# Patient Record
Sex: Female | Born: 1965
Health system: Southern US, Community
[De-identification: ages and names within clinical notes are randomized; demographics above are authoritative.]

## PROBLEM LIST (undated history)

## (undated) DIAGNOSIS — N920 Excessive and frequent menstruation with regular cycle: Secondary | ICD-10-CM

## (undated) DIAGNOSIS — I10 Essential (primary) hypertension: Secondary | ICD-10-CM

## (undated) DIAGNOSIS — G4700 Insomnia, unspecified: Secondary | ICD-10-CM

## (undated) DIAGNOSIS — K219 Gastro-esophageal reflux disease without esophagitis: Secondary | ICD-10-CM

## (undated) DIAGNOSIS — R112 Nausea with vomiting, unspecified: Secondary | ICD-10-CM

## (undated) DIAGNOSIS — D649 Anemia, unspecified: Secondary | ICD-10-CM

## (undated) DIAGNOSIS — Z9889 Other specified postprocedural states: Secondary | ICD-10-CM

## (undated) HISTORY — PX: CHOLECYSTECTOMY: SHX55

## (undated) HISTORY — DX: Essential (primary) hypertension: I10

## (undated) HISTORY — PX: TUBAL LIGATION: SHX77

## (undated) HISTORY — DX: Excessive and frequent menstruation with regular cycle: N92.0

## (undated) HISTORY — PX: CARPAL TUNNEL RELEASE: SHX101

---

## 1998-04-23 ENCOUNTER — Inpatient Hospital Stay (HOSPITAL_COMMUNITY): Admission: AD | Admit: 1998-04-23 | Discharge: 1998-04-23 | Payer: Self-pay | Admitting: Obstetrics

## 1998-04-26 ENCOUNTER — Inpatient Hospital Stay (HOSPITAL_COMMUNITY): Admission: AD | Admit: 1998-04-26 | Discharge: 1998-04-26 | Payer: Self-pay | Admitting: Obstetrics

## 1998-07-29 ENCOUNTER — Inpatient Hospital Stay (HOSPITAL_COMMUNITY): Admission: RE | Admit: 1998-07-29 | Discharge: 1998-07-29 | Payer: Self-pay | Admitting: Obstetrics

## 1998-08-18 ENCOUNTER — Inpatient Hospital Stay (HOSPITAL_COMMUNITY): Admission: AD | Admit: 1998-08-18 | Discharge: 1998-08-22 | Payer: Self-pay

## 1999-01-01 ENCOUNTER — Encounter: Payer: Self-pay | Admitting: Emergency Medicine

## 1999-01-01 ENCOUNTER — Emergency Department (HOSPITAL_COMMUNITY): Admission: EM | Admit: 1999-01-01 | Discharge: 1999-01-01 | Payer: Self-pay | Admitting: Emergency Medicine

## 1999-01-03 ENCOUNTER — Emergency Department (HOSPITAL_COMMUNITY): Admission: EM | Admit: 1999-01-03 | Discharge: 1999-01-04 | Payer: Self-pay | Admitting: Emergency Medicine

## 2000-05-12 ENCOUNTER — Encounter: Payer: Self-pay | Admitting: Emergency Medicine

## 2000-05-12 ENCOUNTER — Emergency Department (HOSPITAL_COMMUNITY): Admission: EM | Admit: 2000-05-12 | Discharge: 2000-05-12 | Payer: Self-pay | Admitting: Emergency Medicine

## 2000-05-25 ENCOUNTER — Emergency Department (HOSPITAL_COMMUNITY): Admission: EM | Admit: 2000-05-25 | Discharge: 2000-05-26 | Payer: Self-pay | Admitting: Emergency Medicine

## 2000-06-12 ENCOUNTER — Emergency Department (HOSPITAL_COMMUNITY): Admission: EM | Admit: 2000-06-12 | Discharge: 2000-06-12 | Payer: Self-pay

## 2000-08-02 ENCOUNTER — Encounter: Payer: Self-pay | Admitting: Emergency Medicine

## 2000-08-02 ENCOUNTER — Emergency Department (HOSPITAL_COMMUNITY): Admission: EM | Admit: 2000-08-02 | Discharge: 2000-08-02 | Payer: Self-pay | Admitting: Emergency Medicine

## 2001-06-24 ENCOUNTER — Emergency Department (HOSPITAL_COMMUNITY): Admission: EM | Admit: 2001-06-24 | Discharge: 2001-06-24 | Payer: Self-pay | Admitting: Emergency Medicine

## 2001-07-15 ENCOUNTER — Emergency Department (HOSPITAL_COMMUNITY): Admission: EM | Admit: 2001-07-15 | Discharge: 2001-07-15 | Payer: Self-pay | Admitting: Emergency Medicine

## 2001-07-15 ENCOUNTER — Encounter: Payer: Self-pay | Admitting: Emergency Medicine

## 2002-04-17 ENCOUNTER — Emergency Department (HOSPITAL_COMMUNITY): Admission: EM | Admit: 2002-04-17 | Discharge: 2002-04-17 | Payer: Self-pay

## 2002-05-21 ENCOUNTER — Emergency Department (HOSPITAL_COMMUNITY): Admission: EM | Admit: 2002-05-21 | Discharge: 2002-05-21 | Payer: Self-pay | Admitting: Emergency Medicine

## 2002-08-01 ENCOUNTER — Emergency Department (HOSPITAL_COMMUNITY): Admission: EM | Admit: 2002-08-01 | Discharge: 2002-08-01 | Payer: Self-pay | Admitting: Emergency Medicine

## 2002-11-05 ENCOUNTER — Emergency Department (HOSPITAL_COMMUNITY): Admission: EM | Admit: 2002-11-05 | Discharge: 2002-11-05 | Payer: Self-pay | Admitting: Emergency Medicine

## 2004-10-09 ENCOUNTER — Emergency Department (HOSPITAL_COMMUNITY): Admission: EM | Admit: 2004-10-09 | Discharge: 2004-10-09 | Payer: Self-pay | Admitting: Emergency Medicine

## 2004-10-27 ENCOUNTER — Emergency Department (HOSPITAL_COMMUNITY): Admission: EM | Admit: 2004-10-27 | Discharge: 2004-10-27 | Payer: Self-pay | Admitting: Emergency Medicine

## 2006-03-16 ENCOUNTER — Emergency Department (HOSPITAL_COMMUNITY): Admission: EM | Admit: 2006-03-16 | Discharge: 2006-03-16 | Payer: Self-pay | Admitting: Emergency Medicine

## 2009-08-16 ENCOUNTER — Emergency Department: Payer: Self-pay | Admitting: Emergency Medicine

## 2009-10-25 ENCOUNTER — Emergency Department: Payer: Self-pay | Admitting: Emergency Medicine

## 2010-07-11 ENCOUNTER — Emergency Department (HOSPITAL_COMMUNITY)
Admission: EM | Admit: 2010-07-11 | Discharge: 2010-07-11 | Disposition: A | Discharge status: Home / Self Care | Payer: Medicaid Other | Attending: Emergency Medicine | Admitting: Emergency Medicine

## 2010-07-11 DIAGNOSIS — K219 Gastro-esophageal reflux disease without esophagitis: Secondary | ICD-10-CM | POA: Insufficient documentation

## 2010-07-11 DIAGNOSIS — R109 Unspecified abdominal pain: Secondary | ICD-10-CM | POA: Insufficient documentation

## 2010-07-15 ENCOUNTER — Other Ambulatory Visit: Payer: Self-pay | Admitting: Gastroenterology

## 2010-07-18 ENCOUNTER — Ambulatory Visit
Admission: RE | Admit: 2010-07-18 | Discharge: 2010-07-18 | Disposition: A | Discharge status: Home / Self Care | Payer: Medicaid Other | Source: Ambulatory Visit | Attending: Gastroenterology | Admitting: Gastroenterology

## 2010-08-03 ENCOUNTER — Encounter (HOSPITAL_COMMUNITY)
Admission: RE | Admit: 2010-08-03 | Discharge: 2010-08-03 | Disposition: A | Discharge status: Home / Self Care | Payer: Medicaid Other | Source: Ambulatory Visit | Attending: Surgery | Admitting: Surgery

## 2010-08-03 LAB — SURGICAL PCR SCREEN: MRSA, PCR: NEGATIVE

## 2010-08-03 LAB — COMPREHENSIVE METABOLIC PANEL
ALT: 13 U/L (ref 0–35)
AST: 16 U/L (ref 0–37)
Calcium: 8.8 mg/dL (ref 8.4–10.5)
Creatinine, Ser: 0.74 mg/dL (ref 0.4–1.2)
GFR calc Af Amer: >60 mL/min (ref >60)
Sodium: 138 mEq/L (ref 135–145)
Total Protein: 6.7 g/dL (ref 6.0–8.3)

## 2010-08-03 LAB — CBC
MCHC: 34.1 g/dL (ref 30.0–36.0)
RDW: 13.7 % (ref 11.5–15.5)

## 2010-08-03 LAB — HCG, SERUM, QUALITATIVE: Preg, Serum: NEGATIVE

## 2010-08-05 ENCOUNTER — Other Ambulatory Visit: Payer: Self-pay | Admitting: Surgery

## 2010-08-05 ENCOUNTER — Observation Stay (HOSPITAL_COMMUNITY)
Admission: RE | Admit: 2010-08-05 | Discharge: 2010-08-05 | Disposition: A | Discharge status: Home / Self Care | Payer: Medicaid Other | Source: Ambulatory Visit | Attending: Surgery | Admitting: Surgery

## 2010-08-05 DIAGNOSIS — K8010 Calculus of gallbladder with chronic cholecystitis without obstruction: Principal | ICD-10-CM | POA: Insufficient documentation

## 2010-08-05 DIAGNOSIS — Z01818 Encounter for other preprocedural examination: Secondary | ICD-10-CM | POA: Insufficient documentation

## 2010-08-05 DIAGNOSIS — Z01812 Encounter for preprocedural laboratory examination: Secondary | ICD-10-CM | POA: Insufficient documentation

## 2010-08-18 NOTE — Op Note (Signed)
Terri Hogan, Terri Hogan                  ACCOUNT NO.:  [id number]  MEDICAL RECORD NO.:  [id number]           PATIENT TYPE:  O  LOCATION:  5156                         FACILITY:  [facility]  PHYSICIAN:  Abigail Miyamoto, M.D. DATE OF BIRTH:  10/31/1965  DATE OF PROCEDURE:  08/05/2010 DATE OF DISCHARGE:  08/05/2010                              OPERATIVE REPORT   PREOPERATIVE DIAGNOSIS:  Symptomatic cholelithiasis.  POSTOPERATIVE DIAGNOSES:  Symptomatic cholelithiasis with cholecystitis.  PROCEDURE:  Laparoscopic cholecystectomy.  SURGEON:  Abigail Miyamoto, MD  ANESTHESIA:  General and 0.5% Marcaine.  ESTIMATED BLOOD LOSS:  Minimal.  FINDINGS:  The patient was found to have acute cholecystitis with impacting gallstone in the gallbladder neck and hydrops of the gallbladder.  PROCEDURE IN DETAIL:  The patient was brought to the operating room, identified as Terri Hogan.  She was placed supine on the operating table and general anesthesia was induced.  Her abdomen was then prepped and draped in usual sterile fashion.  Using a #15-blade, a small vertical incision was made above the umbilicus.  This was carried down to the fascia, which was then opened with a scalpel.  Hemostat was used to pass the peritoneal cavity under direct vision.  Next 0-Vicryl pursestring suture was placed around the fascial opening.  The Hasson port was placed through the opening and insufflation of the abdomen was begun.  A 5-mm port was then placed in the patient's epigastrium and 2 more in the right upper quadrant all under direct vision.  The gallbladder was found to be inflamed and distended.  I then needle aspirated bile from the gallbladder in order to grasp the gallbladder.  The bile was clear consistent with hydrops of the gallbladder.  Once the bile was aspirated, I was able to grasp the gallbladder and retract above the liver bed.  Dissection was then carried out at the base of the gallbladder.  The  cystic duct was dissected out and a critical window was achieved around it.  It was then clipped 3 times proximally and once distally and transected.  The cystic artery and posterior branch were identified and proximally and distally transected as well.  The gallbladder was then slowly dissected free from liver bed with this electrocautery.  The gallbladder and liver bed were quite friable.  Once the gallbladder was completely excised from liver bed, I had to achieve hemostasis with cautery as well as 2 pieces of Surgicel material.  At this point, I irrigated the abdomen.  Hemostasis appeared to be achieved.  I then placed the gallbladder in an Endosac, removed it through the incision at the umbilicus.  I then again examined the liver bed and again hemostasis appeared to be achieved.  All ports were removed under direct vision and the abdomen was deflated.  The 0-Vicryl at the umbilicus was tied in place closing the fascial defect.  All incisions were then anesthetized with Marcaine and closed with 4-0 Monocryl subcuticular sutures.  Steri-Strips and Band-Aids were then applied.  The patient tolerated the procedure well. All counts were correct at the end of the procedure.  The patient was  then extubated in the operating room and taken in a stable condition to the recovery room.     Abigail Miyamoto, M.D.     DB/MEDQ  D:  08/05/2010  T:  08/06/2010  Job:  [id number]  Electronically Signed by Abigail Miyamoto M.D. on 08/18/2010 09:33:39 AM

## 2010-08-23 NOTE — Discharge Summary (Signed)
  NAMEMCKAYLIN, Terri Hogan                  ACCOUNT NO.:  [id number]  MEDICAL RECORD NO.:  [id number]           PATIENT TYPE:  O  LOCATION:  5156                         FACILITY:  [facility]  PHYSICIAN:  Abigail Miyamoto, M.D. DATE OF BIRTH:  1965-09-20  DATE OF ADMISSION:  08/05/2010 DATE OF DISCHARGE:  08/05/2010                              DISCHARGE SUMMARY   DISCHARGE DIAGNOSIS:  Symptomatic cholelithiasis with cholecystitis.  SUMMARY OF HISTORY:  This is a female with symptomatic cholelithiasis. She is being admitted for a laparoscopic cholecystectomy.  HOSPITAL COURSE:  The patient was admitted and taken to the operating room where she underwent a laparoscopic cholecystectomy.  She tolerated the procedure well and was taken to the regular floor.  She was then discharged uneventfully.  DISCHARGE DIET:  Regular.  DISCHARGE ACTIVITY:  She is to do no heavy lifting for 2 weeks after discharge.  She will resume her home medications.  She will follow up with [facility] in 2 weeks post discharge.     Abigail Miyamoto, M.D.     DB/MEDQ  D:  08/18/2010  T:  08/19/2010  Job:  [id number]  Electronically Signed by Abigail Miyamoto M.D. on 08/23/2010 10:44:04 AM

## 2011-10-15 ENCOUNTER — Emergency Department: Payer: Self-pay | Admitting: *Deleted

## 2011-10-16 LAB — BASIC METABOLIC PANEL
Anion Gap: 8 (ref 7–16)
BUN: 9 mg/dL (ref 7–18)
Calcium, Total: 7.5 mg/dL — ABNORMAL LOW (ref 8.5–10.1)
Creatinine: 0.79 mg/dL (ref 0.60–1.30)
EGFR (Non-African Amer.): >60
Sodium: 141 mmol/L (ref 136–145)

## 2011-10-16 LAB — CBC WITH DIFFERENTIAL/PLATELET
Basophil #: 0.3 10*3/uL — ABNORMAL HIGH (ref 0.0–0.1)
Basophil %: 3.8 %
Eosinophil %: 1.1 %
HCT: 37.6 % (ref 35.0–47.0)
Lymphocyte #: 1.8 10*3/uL (ref 1.0–3.6)
Lymphocyte %: 20.5 %
MCH: 29.2 pg (ref 26.0–34.0)
MCV: 89 fL (ref 80–100)
Neutrophil #: 6.1 10*3/uL (ref 1.4–6.5)
Neutrophil %: 70.6 %
Platelet: 318 10*3/uL (ref 150–440)
RDW: 14.7 % — ABNORMAL HIGH (ref 11.5–14.5)
WBC: 8.6 10*3/uL (ref 3.6–11.0)

## 2012-07-07 DIAGNOSIS — Z79899 Other long term (current) drug therapy: Secondary | ICD-10-CM | POA: Insufficient documentation

## 2012-07-07 DIAGNOSIS — R079 Chest pain, unspecified: Secondary | ICD-10-CM | POA: Insufficient documentation

## 2012-07-07 DIAGNOSIS — J029 Acute pharyngitis, unspecified: Secondary | ICD-10-CM | POA: Insufficient documentation

## 2012-07-07 DIAGNOSIS — F17200 Nicotine dependence, unspecified, uncomplicated: Secondary | ICD-10-CM | POA: Insufficient documentation

## 2012-07-07 DIAGNOSIS — J40 Bronchitis, not specified as acute or chronic: Secondary | ICD-10-CM | POA: Insufficient documentation

## 2012-07-07 DIAGNOSIS — IMO0002 Reserved for concepts with insufficient information to code with codable children: Secondary | ICD-10-CM | POA: Insufficient documentation

## 2012-07-08 ENCOUNTER — Emergency Department (HOSPITAL_COMMUNITY): Payer: Self-pay

## 2012-07-08 ENCOUNTER — Emergency Department (HOSPITAL_COMMUNITY)
Admission: EM | Admit: 2012-07-08 | Discharge: 2012-07-08 | Disposition: A | Discharge status: Home / Self Care | Payer: Self-pay | Attending: Emergency Medicine | Admitting: Emergency Medicine

## 2012-07-08 ENCOUNTER — Encounter (HOSPITAL_COMMUNITY): Payer: Self-pay | Admitting: Emergency Medicine

## 2012-07-08 DIAGNOSIS — J40 Bronchitis, not specified as acute or chronic: Secondary | ICD-10-CM

## 2012-07-08 DIAGNOSIS — Z720 Tobacco use: Secondary | ICD-10-CM

## 2012-07-08 MED ORDER — PREDNISONE 20 MG PO TABS
60.0000 mg | ORAL_TABLET | Freq: Once | ORAL | Status: AC
  Administered 2012-07-08: 60 mg via ORAL
  Filled 2012-07-08: qty 3

## 2012-07-08 MED ORDER — HYDROCOD POLST-CHLORPHEN POLST 10-8 MG/5ML PO LQCR: 5.0000 mL | Freq: Two times a day (BID) | ORAL | Status: DC | PRN

## 2012-07-08 MED ORDER — BENZONATATE 100 MG PO CAPS
200.0000 mg | ORAL_CAPSULE | Freq: Three times a day (TID) | ORAL | Status: DC | PRN
  Administered 2012-07-08: 200 mg via ORAL
  Filled 2012-07-08: qty 2

## 2012-07-08 MED ORDER — PREDNISONE 10 MG PO TABS: 20.0000 mg | ORAL_TABLET | Freq: Every day | ORAL | Status: DC

## 2012-07-08 MED ORDER — ALBUTEROL SULFATE HFA 108 (90 BASE) MCG/ACT IN AERS
1.0000 | INHALATION_SPRAY | RESPIRATORY_TRACT | Status: DC | PRN
  Administered 2012-07-08: 1 via RESPIRATORY_TRACT
  Filled 2012-07-08: qty 6.7

## 2012-07-08 NOTE — ED Provider Notes (Signed)
History     CSN: [id number]  Arrival date & time 07/07/12  2352   First MD Initiated Contact with Patient 07/08/12 0025      Chief Complaint  Patient presents with  . Cough    (Consider location/radiation/quality/duration/timing/severity/associated sxs/prior treatment) HPI 47 year old female presents to the emergency department with complaint of cough, sore throat and rib pain since Wednesday. Patient is a smoker, reports she smokes one half pack a day. She has history of bronchitis in the past. She denies any fever. Cough is nonproductive. She has not taken any medicine prior to arrival. History reviewed. No pertinent past medical history.  Past Surgical History  Procedure Laterality Date  . Carpal tunnel release    . Cholecystectomy      No family history on file.  History  Substance Use Topics  . Smoking status: Current Every Day Smoker  . Smokeless tobacco: Not on file  . Alcohol Use: No    OB History   Grav Para Term Preterm Abortions TAB SAB Ect Mult Living                  Review of Systems  All other systems reviewed and are negative.    Allergies  Codeine  Home Medications   Current Outpatient Rx  Name  Route  Sig  Dispense  Refill  . albuterol (PROVENTIL HFA;VENTOLIN HFA) 108 (90 BASE) MCG/ACT inhaler   Inhalation   Inhale 2 puffs into the lungs once.         Marland Kitchen ibuprofen (ADVIL,MOTRIN) 200 MG tablet   Oral   Take 600 mg by mouth every 6 (six) hours as needed for pain or headache.         . chlorpheniramine-HYDROcodone (TUSSIONEX PENNKINETIC ER) 10-8 MG/5ML LQCR   Oral   Take 5 mLs by mouth every 12 (twelve) hours as needed.   140 mL   0   . predniSONE (DELTASONE) 10 MG tablet   Oral   Take 2 tablets (20 mg total) by mouth daily.   15 tablet   0     BP 135/90  Pulse 96  Temp(Src) 97.9 F (36.6 C) (Oral)  Resp 20  SpO2 99%  LMP 06/05/2012  Physical Exam  Nursing note and vitals reviewed. Constitutional: She is oriented to  person, place, and time. She appears well-developed and well-nourished. She appears distressed (uncomfortable appearing).  HENT:  Head: Normocephalic and atraumatic.  Right Ear: External ear normal.  Left Ear: External ear normal.  Nose: Nose normal.  Mouth/Throat: Oropharynx is clear and moist.  Eyes: Conjunctivae and EOM are normal. Pupils are equal, round, and reactive to light.  Neck: Normal range of motion. Neck supple. No JVD present. No tracheal deviation present. No thyromegaly present.  Cardiovascular: Normal rate, regular rhythm, normal heart sounds and intact distal pulses.  Exam reveals no gallop and no friction rub.   No murmur heard. Pulmonary/Chest: Effort normal and breath sounds normal. No stridor. No respiratory distress. She has no wheezes. She has no rales. She exhibits no tenderness.  Cough noted  Abdominal: Soft. Bowel sounds are normal. She exhibits no distension and no mass. There is no tenderness. There is no rebound and no guarding.  Musculoskeletal: Normal range of motion. She exhibits no edema and no tenderness.  Lymphadenopathy:    She has no cervical adenopathy.  Neurological: She is alert and oriented to person, place, and time. She has normal reflexes. No cranial nerve deficit. She exhibits normal muscle tone.  Coordination normal.  Skin: Skin is warm and dry. No rash noted. No erythema. No pallor.  Psychiatric: She has a normal mood and affect. Her behavior is normal. Judgment and thought content normal.    ED Course  Procedures (including critical care time)  Labs Reviewed - No data to display Dg Chest 2 View  07/08/2012  *RADIOLOGY REPORT*  Clinical Data: Cough  CHEST - 2 VIEW  Comparison: None.  Findings: Mild peribronchial thickening.  No confluent airspace opacity.  No pleural effusion or pneumothorax. Cardiomediastinal contours within normal range.  No acute osseous finding.  Surgical clips right upper quadrant.  IMPRESSION: Mild peribronchial  thickening, may reflect acute or chronic bronchitis.   Original Report Authenticated By: Jearld Lesch, M.D.      1. Bronchitis   2. Tobacco abuse       MDM  47 year old female with history of prior bronchitis who presents with persistent cough and chest wall pain. Chest x-ray consistent with bronchitis as is her physical exam. Patient has been advised that she needs to stop smoking. Will treat symptoms.        Olivia Mackie, MD 07/08/12 [phone]

## 2012-07-08 NOTE — ED Notes (Addendum)
C/o non-productive cough since Wednesday night with sore throat and bilateral rib pain.

## 2012-07-30 ENCOUNTER — Encounter (HOSPITAL_COMMUNITY): Payer: Self-pay | Admitting: Emergency Medicine

## 2012-07-30 ENCOUNTER — Emergency Department (HOSPITAL_COMMUNITY): Payer: Self-pay

## 2012-07-30 ENCOUNTER — Emergency Department (HOSPITAL_COMMUNITY)
Admission: EM | Admit: 2012-07-30 | Discharge: 2012-07-30 | Disposition: A | Discharge status: Home / Self Care | Payer: Self-pay | Attending: Emergency Medicine | Admitting: Emergency Medicine

## 2012-07-30 DIAGNOSIS — M65839 Other synovitis and tenosynovitis, unspecified forearm: Secondary | ICD-10-CM | POA: Insufficient documentation

## 2012-07-30 DIAGNOSIS — IMO0001 Reserved for inherently not codable concepts without codable children: Secondary | ICD-10-CM | POA: Insufficient documentation

## 2012-07-30 DIAGNOSIS — F17200 Nicotine dependence, unspecified, uncomplicated: Secondary | ICD-10-CM | POA: Insufficient documentation

## 2012-07-30 DIAGNOSIS — Z79899 Other long term (current) drug therapy: Secondary | ICD-10-CM | POA: Insufficient documentation

## 2012-07-30 MED ORDER — HYDROCODONE-ACETAMINOPHEN 5-325 MG PO TABS
2.0000 | ORAL_TABLET | Freq: Once | ORAL | Status: AC
  Administered 2012-07-30: 2 via ORAL
  Filled 2012-07-30: qty 2

## 2012-07-30 MED ORDER — IBUPROFEN 800 MG PO TABS: 800.0000 mg | ORAL_TABLET | Freq: Three times a day (TID) | ORAL | Status: DC

## 2012-07-30 MED ORDER — HYDROCODONE-ACETAMINOPHEN 5-325 MG PO TABS: 2.0000 | ORAL_TABLET | ORAL | Status: DC | PRN

## 2012-07-30 NOTE — ED Notes (Signed)
Woke up with left wrist pain 10/10 achy sharp. Radial pulse +2. Full sensation.

## 2012-07-30 NOTE — ED Provider Notes (Signed)
Medical screening examination/treatment/procedure(s) were performed by non-physician practitioner and as supervising physician I was immediately available for consultation/collaboration.    Vida Roller, MD 07/30/12 2155

## 2012-07-30 NOTE — ED Provider Notes (Signed)
History     CSN: [id number]  Arrival date & time 07/30/12  4098   First MD Initiated Contact with Patient 07/30/12 1045      Chief Complaint  Patient presents with  . Wrist Pain    (Consider location/radiation/quality/duration/timing/severity/associated sxs/prior treatment) Patient is a 47 y.o. female presenting with wrist pain. The history is provided by the patient. No language interpreter was used.  Wrist Pain This is a new problem. The current episode started today. The problem occurs constantly. The problem has been unchanged. Associated symptoms include joint swelling and myalgias. Nothing aggravates the symptoms. She has tried nothing for the symptoms. The treatment provided moderate relief.    History reviewed. No pertinent past medical history.  Past Surgical History  Procedure Laterality Date  . Carpal tunnel release    . Cholecystectomy      No family history on file.  History  Substance Use Topics  . Smoking status: Current Every Day Smoker  . Smokeless tobacco: Not on file  . Alcohol Use: No    OB History   Grav Para Term Preterm Abortions TAB SAB Ect Mult Living                  Review of Systems  Musculoskeletal: Positive for myalgias and joint swelling.  All other systems reviewed and are negative.    Allergies  Codeine  Home Medications   Current Outpatient Rx  Name  Route  Sig  Dispense  Refill  . albuterol (PROVENTIL HFA;VENTOLIN HFA) 108 (90 BASE) MCG/ACT inhaler   Inhalation   Inhale 2 puffs into the lungs once.           BP 137/80  Pulse 87  Temp(Src) 98.2 F (36.8 C) (Oral)  Resp 18  Ht 5' 4.5" (1.638 m)  Wt 185 lb (83.915 kg)  BMI 31.28 kg/m2  SpO2 100%  LMP 07/22/2012  Physical Exam  Nursing note and vitals reviewed. Constitutional: She is oriented to person, place, and time. She appears well-developed and well-nourished.  HENT:  Head: Normocephalic.  Musculoskeletal: She exhibits tenderness.  Swollen left wrist  decreased range of motion  nv intact,  Ns intact  Neurological: She is alert and oriented to person, place, and time. She has normal reflexes.  Skin: Skin is warm.  Psychiatric: She has a normal mood and affect.    ED Course  Procedures (including critical care time)  Labs Reviewed - No data to display Dg Wrist Complete Left  07/30/2012  *RADIOLOGY REPORT*  Clinical Data: Left wrist pain, decreased range of motion, no known injury  LEFT WRIST - COMPLETE 3+ VIEW  Comparison: None.  Findings: No fracture or dislocation is seen.  The joint spaces are preserved.  The visualized soft tissues are unremarkable.  IMPRESSION: No acute osseous abnormality is seen.   Original Report Authenticated By: Charline Bills, M.D.      No diagnosis found.    MDM  Thumb spica,   Follow up with Dr. Glenna Fellows surgeon for evaluation.   rx for hydrocodone and ibuprofen        Elson Areas, [place] 07/30/12 1124

## 2012-07-30 NOTE — Progress Notes (Signed)
Orthopedic Tech Progress Note Patient Details:  Terri Hogan 08-Jul-1965 [id number] Left velcro thumb spica applied. Tolerated well.  Ortho Devices Type of Ortho Device: Thumb velcro splint Ortho Device/Splint Interventions: Application   Asia R Thompson 07/30/2012, 11:50 AM

## 2013-10-17 ENCOUNTER — Emergency Department (HOSPITAL_COMMUNITY): Payer: Medicaid Other

## 2013-10-17 ENCOUNTER — Encounter (HOSPITAL_COMMUNITY): Payer: Self-pay | Admitting: Emergency Medicine

## 2013-10-17 ENCOUNTER — Emergency Department (HOSPITAL_COMMUNITY)
Admission: EM | Admit: 2013-10-17 | Discharge: 2013-10-17 | Disposition: A | Discharge status: Home / Self Care | Payer: Medicaid Other | Attending: Emergency Medicine | Admitting: Emergency Medicine

## 2013-10-17 DIAGNOSIS — J069 Acute upper respiratory infection, unspecified: Secondary | ICD-10-CM | POA: Insufficient documentation

## 2013-10-17 DIAGNOSIS — G479 Sleep disorder, unspecified: Secondary | ICD-10-CM | POA: Insufficient documentation

## 2013-10-17 DIAGNOSIS — F17200 Nicotine dependence, unspecified, uncomplicated: Secondary | ICD-10-CM | POA: Insufficient documentation

## 2013-10-17 MED ORDER — GUAIFENESIN 100 MG/5ML PO LIQD: 100.0000 mg | ORAL | Status: DC | PRN

## 2013-10-17 MED ORDER — PSEUDOEPHEDRINE HCL 30 MG/5ML PO SYRP: 60.0000 mg | ORAL_SOLUTION | Freq: Four times a day (QID) | ORAL | Status: DC | PRN

## 2013-10-17 NOTE — ED Notes (Signed)
Pt in c/o cough and congestion over the last week, states she has a history of bronchitis and this feels the same, pain with coughing, denies fever at home, states she can't get any sleep due to the cough, cough nonproductive

## 2013-10-17 NOTE — ED Provider Notes (Signed)
CSN: [id number]     Arrival date & time 10/17/13  7741 History   First MD Initiated Contact with Patient 10/17/13 [phone]    This chart was scribed for non-physician practitioner, Noland Fordyce, working with No att. providers found by Terressa Koyanagi, ED Scribe. This patient was seen in room TR07C/TR07C and the patient's care was started at 10:08 AM.  Chief Complaint  Patient presents with  . Cough   The history is provided by the patient. No language interpreter was used.   HPI Comments: Terri Hogan is a 48 y.o. female, with a history of cholecystectomy and carpal tunnel release, who presents to the Emergency Department complaining of a intermittent, nonproductive cough with associated congestion onset approximately 1 week ago. Pt also complains of associated loss of appetite and sleep disturbance. Pt denies taking any measures at home to alleviate her Sx. Pt reports a Hx of bronchitis and states that she gets bronchitis annually. Pt specifies that her current Sx are similar to those she experiences in the early stages of developing bronchitis. Pt denies nausea, vomiting or fever. Pt reports she is a current everyday smoker who smokes .5 ppd. Pt reports medicine allergy to Codeine only.   History reviewed. No pertinent past medical history. Past Surgical History  Procedure Laterality Date  . Carpal tunnel release    . Cholecystectomy     History reviewed. No pertinent family history. History  Substance Use Topics  . Smoking status: Current Every Day Smoker  . Smokeless tobacco: Not on file  . Alcohol Use: No   OB History   Grav Para Term Preterm Abortions TAB SAB Ect Mult Living                 Review of Systems  Constitutional: Negative for fever.  HENT: Positive for congestion.   Respiratory: Positive for cough (nonproductive).   Gastrointestinal: Negative for nausea and vomiting.  Psychiatric/Behavioral: Positive for sleep disturbance (due to nonproductive ).  All other systems  reviewed and are negative.     Allergies  Codeine  Home Medications   Prior to Admission medications   Medication Sig Start Date End Date Taking? Authorizing Provider  Phenylephrine-DM-GG (TUSSIN CF PO) Take 10 mLs by mouth every 6 (six) hours as needed (cough).   Yes Historical Provider, MD  guaiFENesin (ROBITUSSIN) 100 MG/5ML liquid Take 5-10 mLs (100-200 mg total) by mouth every 4 (four) hours as needed for cough. 10/17/13   Noland Fordyce, PA-C  pseudoephedrine (SUDAFED) 30 MG/5ML syrup Take 10 mLs (60 mg total) by mouth 4 (four) times daily as needed for congestion. 10/17/13   Noland Fordyce, PA-C   Triage Vitals: BP 139/82  Pulse 70  Temp(Src) 97.9 F (36.6 C) (Oral)  Resp 20  SpO2 98%  Physical Exam  Nursing note and vitals reviewed. Constitutional: She is oriented to person, place, and time. She appears well-developed and well-nourished.  Pt appears well, non-toxic. NAD.  HENT:  Head: Normocephalic and atraumatic.  Right Ear: Hearing, tympanic membrane, external ear and ear canal normal.  Left Ear: Hearing, tympanic membrane, external ear and ear canal normal.  Nose: Nose normal.  Mouth/Throat: Uvula is midline, oropharynx is clear and moist and mucous membranes are normal.  Eyes: EOM are normal.  Neck: Normal range of motion.  Cardiovascular: Normal rate.   Pulmonary/Chest: Effort normal. No respiratory distress. She has wheezes (faint expiratory wheeze in left lower lobe ). She has no rales. She exhibits no tenderness.  Abdominal: Soft. Bowel  sounds are normal. She exhibits no distension and no mass. There is no tenderness. There is no rebound and no guarding.  Musculoskeletal: Normal range of motion.  Neurological: She is alert and oriented to person, place, and time.  Skin: Skin is warm and dry.  Psychiatric: She has a normal mood and affect. Her behavior is normal.    ED Course  Procedures (including critical care time) DIAGNOSTIC STUDIES: Oxygen Saturation is  98% on RA, normal by my interpretation.    COORDINATION OF CARE:  10:11 AM-Discussed treatment plan which includes imaging (chest x-ray) with pt at bedside and pt agreed to plan.   Labs Review Labs Reviewed - No data to display  Imaging Review Dg Chest 2 View  10/17/2013   CLINICAL DATA:  Chest pain and cough  EXAM: CHEST  2 VIEW  COMPARISON:  July 07, 2012  FINDINGS: There is no edema or consolidation. The heart size and pulmonary vascularity are normal. No adenopathy. No pneumothorax. No bone lesions. There are surgical clips in the right upper quadrant.  IMPRESSION: No edema or consolidation.   Electronically Signed   By: Lowella Grip M.D.   On: 10/17/2013 10:46     EKG Interpretation None      MDM   Final diagnoses:  URI, acute  Current smoker    Will tx for URI. Discussed smoking cessation. Home care instructions provided. Rx: sudafed and robitussin. Advised to f/u with PCP. Return precautions provided. Pt verbalized understanding and agreement with tx plan.   I personally performed the services described in this documentation, which was scribed in my presence. The recorded information has been reviewed and is accurate.    Noland Fordyce, PA-C 10/17/13 1643

## 2013-10-20 NOTE — ED Provider Notes (Signed)
History/physical exam/procedure(s) were performed by non-physician practitioner and as supervising physician I was immediately available for consultation/collaboration. I have reviewed all notes and am in agreement with care and plan.   Shaune Pollack, MD 10/20/13 [phone]

## 2014-06-18 ENCOUNTER — Emergency Department (HOSPITAL_COMMUNITY): Payer: Medicaid Other

## 2014-06-18 ENCOUNTER — Encounter (HOSPITAL_COMMUNITY): Payer: Self-pay | Admitting: Emergency Medicine

## 2014-06-18 ENCOUNTER — Emergency Department (HOSPITAL_COMMUNITY)
Admission: EM | Admit: 2014-06-18 | Discharge: 2014-06-18 | Disposition: A | Discharge status: Home / Self Care | Payer: Medicaid Other | Attending: Emergency Medicine | Admitting: Emergency Medicine

## 2014-06-18 DIAGNOSIS — Y9389 Activity, other specified: Secondary | ICD-10-CM | POA: Insufficient documentation

## 2014-06-18 DIAGNOSIS — Y9289 Other specified places as the place of occurrence of the external cause: Secondary | ICD-10-CM | POA: Insufficient documentation

## 2014-06-18 DIAGNOSIS — Z720 Tobacco use: Secondary | ICD-10-CM | POA: Insufficient documentation

## 2014-06-18 DIAGNOSIS — S20211A Contusion of right front wall of thorax, initial encounter: Secondary | ICD-10-CM | POA: Insufficient documentation

## 2014-06-18 DIAGNOSIS — Y998 Other external cause status: Secondary | ICD-10-CM | POA: Insufficient documentation

## 2014-06-18 MED ORDER — HYDROCODONE-ACETAMINOPHEN 5-325 MG PO TABS: 1.0000 | ORAL_TABLET | Freq: Four times a day (QID) | ORAL | Status: DC | PRN

## 2014-06-18 MED ORDER — IBUPROFEN 800 MG PO TABS: 800.0000 mg | ORAL_TABLET | Freq: Three times a day (TID) | ORAL | Status: DC

## 2014-06-18 NOTE — Discharge Instructions (Signed)
Call for a follow up appointment with a Family or Primary Care Provider.  Return if Symptoms worsen.   Take medication as prescribed.  Use incentive spirometer 3-4 times a day to reduce chance of getting pneumonia. Ice your chest 3-4 times a day.
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
## 2014-06-18 NOTE — ED Notes (Signed)
Pt showed how to use incentive Spirometry, pt showed return demonstration of use.

## 2014-08-11 ENCOUNTER — Encounter (HOSPITAL_COMMUNITY): Payer: Self-pay

## 2014-08-11 ENCOUNTER — Emergency Department (HOSPITAL_COMMUNITY)
Admission: EM | Admit: 2014-08-11 | Discharge: 2014-08-11 | Disposition: A | Discharge status: Home / Self Care | Payer: Medicaid Other | Attending: Emergency Medicine | Admitting: Emergency Medicine

## 2014-08-11 DIAGNOSIS — Z9049 Acquired absence of other specified parts of digestive tract: Secondary | ICD-10-CM | POA: Insufficient documentation

## 2014-08-11 DIAGNOSIS — N920 Excessive and frequent menstruation with regular cycle: Secondary | ICD-10-CM

## 2014-08-11 DIAGNOSIS — N946 Dysmenorrhea, unspecified: Secondary | ICD-10-CM

## 2014-08-11 DIAGNOSIS — Z3202 Encounter for pregnancy test, result negative: Secondary | ICD-10-CM | POA: Insufficient documentation

## 2014-08-11 DIAGNOSIS — R531 Weakness: Secondary | ICD-10-CM | POA: Insufficient documentation

## 2014-08-11 DIAGNOSIS — Z720 Tobacco use: Secondary | ICD-10-CM | POA: Insufficient documentation

## 2014-08-11 DIAGNOSIS — Z791 Long term (current) use of non-steroidal anti-inflammatories (NSAID): Secondary | ICD-10-CM | POA: Insufficient documentation

## 2014-08-11 LAB — CBC WITH DIFFERENTIAL/PLATELET
BASOS ABS: 0 10*3/uL (ref 0.0–0.1)
BASOS PCT: 0 % (ref 0–1)
EOS ABS: 0.1 10*3/uL (ref 0.0–0.7)
EOS PCT: 1 % (ref 0–5)
HCT: 38 % (ref 36.0–46.0)
HEMOGLOBIN: 12.2 g/dL (ref 12.0–15.0)
LYMPHS PCT: 33 % (ref 12–46)
Lymphs Abs: 2.4 10*3/uL (ref 0.7–4.0)
MCH: 26.9 pg (ref 26.0–34.0)
MCHC: 32.1 g/dL (ref 30.0–36.0)
MCV: 83.7 fL (ref 78.0–100.0)
MONO ABS: 0.4 10*3/uL (ref 0.1–1.0)
Monocytes Relative: 6 % (ref 3–12)
NEUTROS ABS: 4.3 10*3/uL (ref 1.7–7.7)
NEUTROS PCT: 60 % (ref 43–77)
Platelets: 427 10*3/uL — ABNORMAL HIGH (ref 150–400)
RBC: 4.54 MIL/uL (ref 3.87–5.11)
RDW: 15 % (ref 11.5–15.5)
WBC: 7.3 10*3/uL (ref 4.0–10.5)

## 2014-08-11 LAB — WET PREP, GENITAL
Clue Cells Wet Prep HPF POC: NONE SEEN
Trich, Wet Prep: NONE SEEN
Yeast Wet Prep HPF POC: NONE SEEN

## 2014-08-11 LAB — COMPREHENSIVE METABOLIC PANEL
ALK PHOS: 81 U/L (ref 39–117)
ALT: 12 U/L (ref 0–35)
AST: 16 U/L (ref 0–37)
Albumin: 3.8 g/dL (ref 3.5–5.2)
Anion gap: 9 (ref 5–15)
BUN: 7 mg/dL (ref 6–23)
CO2: 25 mmol/L (ref 19–32)
Calcium: 9.5 mg/dL (ref 8.4–10.5)
Chloride: 107 mmol/L (ref 96–112)
Creatinine, Ser: 0.76 mg/dL (ref 0.50–1.10)
GLUCOSE: 119 mg/dL — AB (ref 70–99)
POTASSIUM: 4 mmol/L (ref 3.5–5.1)
SODIUM: 141 mmol/L (ref 135–145)
Total Bilirubin: <0.1 mg/dL — ABNORMAL LOW (ref 0.3–1.2)
Total Protein: 6.7 g/dL (ref 6.0–8.3)

## 2014-08-11 LAB — SAMPLE TO BLOOD BANK

## 2014-08-11 LAB — I-STAT BETA HCG BLOOD, ED (MC, WL, AP ONLY): I-stat hCG, quantitative: <5 m[IU]/mL (ref <5)

## 2014-08-11 MED ORDER — NAPROXEN 500 MG PO TABS: 500.0000 mg | ORAL_TABLET | Freq: Two times a day (BID) | ORAL | Status: DC

## 2014-08-11 MED ORDER — MEGESTROL ACETATE 40 MG PO TABS
40.0000 mg | ORAL_TABLET | Freq: Once | ORAL | Status: AC
  Administered 2014-08-11: 40 mg via ORAL
  Filled 2014-08-11: qty 1

## 2014-08-11 MED ORDER — TRAMADOL HCL 50 MG PO TABS
50.0000 mg | ORAL_TABLET | Freq: Once | ORAL | Status: AC
  Administered 2014-08-11: 50 mg via ORAL
  Filled 2014-08-11: qty 1

## 2014-08-11 MED ORDER — MEGESTROL ACETATE 40 MG PO TABS: 40.0000 mg | ORAL_TABLET | Freq: Every day | ORAL | Status: DC

## 2014-08-11 MED ORDER — OXYCODONE-ACETAMINOPHEN 5-325 MG PO TABS
1.0000 | ORAL_TABLET | Freq: Once | ORAL | Status: AC
  Administered 2014-08-11: 1 via ORAL

## 2014-08-11 MED ORDER — OXYCODONE-ACETAMINOPHEN 5-325 MG PO TABS
ORAL_TABLET | ORAL | Status: AC
  Filled 2014-08-11: qty 1

## 2014-08-11 NOTE — ED Provider Notes (Signed)
CSN: [id number]     Arrival date & time 08/11/14  1425 History   First MD Initiated Contact with Patient 08/11/14 1707     Chief Complaint  Patient presents with  . Vaginal Bleeding  . Abdominal Pain     (Consider location/radiation/quality/duration/timing/severity/associated sxs/prior Treatment) HPI  Pt is a 49yo female [id number], presenting to ED with 9 year hx of gradually worsening vaginal bleeding with associated lower abdominal cramping c/w menstrual cramping which is relieved by OTC ibuprofen.  Lower abdominal cramping today is 10/10, feels similar to previous menstrual cramps.  Denies taking pain medication PTA.  Today, pt states she is here because she has felt increased fatigue and has had to change her clothes twice today due to significant vaginal bleeding.  Denies change in abdominal pain. Denies urinary symptoms or concern for STD. Denies fever, chills, n/v/d. Denies passing out. No previous hx of syncope. Denies taking OCP to help with menstrual cycle as pt states she has had a hysterectomy and has not followed up with a gynecologist to discuss her heavy menstrual cycles.  Mother states she had uterine fibroids when she was growing up but pt has not been checked for fibroids. States she has not f/u as she does not "like all of this" referring to pelvic exam. Pt does not have a PCP.  History reviewed. No pertinent past medical history. Past Surgical History  Procedure Laterality Date  . Carpal tunnel release    . Cholecystectomy     No family history on file. History  Substance Use Topics  . Smoking status: Current Every Day Smoker  . Smokeless tobacco: Not on file  . Alcohol Use: No   OB History    No data available     Review of Systems  Constitutional: Negative for fever and chills.  Respiratory: Negative for cough and shortness of breath.   Cardiovascular: Negative for chest pain, palpitations and leg swelling.  Gastrointestinal: Negative for nausea, vomiting,  abdominal pain, diarrhea and constipation.  Genitourinary: Positive for vaginal bleeding, menstrual problem ( heavy menstraul cycle for 9 years) and pelvic pain ( menstraul cramping). Negative for dysuria, urgency, frequency, hematuria, flank pain, vaginal discharge and vaginal pain.  Musculoskeletal: Negative for myalgias, back pain, arthralgias and gait problem.  Neurological: Positive for weakness ( generalized).  All other systems reviewed and are negative.     Allergies  Codeine  Home Medications   Prior to Admission medications   Medication Sig Start Date End Date Taking? Authorizing Provider  ibuprofen (ADVIL,MOTRIN) 800 MG tablet Take 1 tablet (800 mg total) by mouth 3 (three) times daily. 06/18/14  Yes Harvie Heck, PA-C  guaiFENesin (ROBITUSSIN) 100 MG/5ML liquid Take 5-10 mLs (100-200 mg total) by mouth every 4 (four) hours as needed for cough. Patient not taking: Reported on 08/11/2014 10/17/13   Noland Fordyce, PA-C  HYDROcodone-acetaminophen (NORCO/VICODIN) 5-325 MG per tablet Take 1 tablet by mouth every 6 (six) hours as needed for moderate pain or severe pain. Patient not taking: Reported on 08/11/2014 06/18/14   Harvie Heck, PA-C  megestrol (MEGACE) 40 MG tablet Take 1 tablet (40 mg total) by mouth daily. 08/11/14   Noland Fordyce, PA-C  naproxen (NAPROSYN) 500 MG tablet Take 1 tablet (500 mg total) by mouth 2 (two) times daily. 08/11/14   Noland Fordyce, PA-C  pseudoephedrine (SUDAFED) 30 MG/5ML syrup Take 10 mLs (60 mg total) by mouth 4 (four) times daily as needed for congestion. Patient not taking: Reported on 08/11/2014 10/17/13  Noland Fordyce, PA-C   BP 132/83 mmHg  Pulse 71  Temp(Src) 98.1 F (36.7 C) (Oral)  Resp 16  Ht 5' 4.5" (1.638 m)  Wt 185 lb (83.915 kg)  BMI 31.28 kg/m2  SpO2 100%  LMP 08/10/2014 Physical Exam  Constitutional: She appears well-developed and well-nourished. No distress.  Pt lying on exam bed with her arms folded across her chest,  angry/disgruntled appearing.   HENT:  Head: Normocephalic and atraumatic.  Eyes: Conjunctivae are normal. No scleral icterus.  Neck: Normal range of motion.  Cardiovascular: Normal rate, regular rhythm and normal heart sounds.   Pulmonary/Chest: Effort normal and breath sounds normal. No respiratory distress. She has no wheezes. She has no rales. She exhibits no tenderness.  Abdominal: Soft. Bowel sounds are normal. She exhibits no distension and no mass. There is no tenderness. There is no rebound and no guarding.  Soft, non-distended, non-tender  Genitourinary: Cervix exhibits no motion tenderness, no discharge and no friability. Right adnexum displays no mass, no tenderness and no fullness. Left adnexum displays no mass, no tenderness and no fullness. There is bleeding in the vagina.  Chaperoned exam. Normal external genitalia.  Vaginal canal: moderate vaginal bleeding c/w menstrual cycle.  No masses. No discharge. No CMT, adnexal tenderness or masses.   Musculoskeletal: Normal range of motion.  Neurological: She is alert.  Skin: Skin is warm and dry. She is not diaphoretic.  Nursing note and vitals reviewed.   ED Course  Procedures (including critical care time) Labs Review Labs Reviewed  WET PREP, GENITAL - Abnormal; Notable for the following:    WBC, Wet Prep HPF POC FEW (*)    All other components within normal limits  CBC WITH DIFFERENTIAL/PLATELET - Abnormal; Notable for the following:    Platelets 427 (*)    All other components within normal limits  COMPREHENSIVE METABOLIC PANEL - Abnormal; Notable for the following:    Glucose, Bld 119 (*)    Total Bilirubin <0.1 (*)    All other components within normal limits  I-STAT BETA HCG BLOOD, ED ([facility], [facility], [facility] ONLY)  SAMPLE TO BLOOD BANK  GC/CHLAMYDIA PROBE AMP ([place])    Imaging Review No results found.   EKG Interpretation None      MDM   Final diagnoses:  Menorrhagia with regular cycle  Menstrual cramp   Pt  is a 49yo female with hx of heavy menstrual cycles for at least 9 years. Denies f/u with OB/GYN as she "does not like all this" referring to pelvic exam.  Abdomen is soft, non-tender. Pt does have vaginal bleeding on exam c/w menstrual cycle. No CMT, adnexal tenderness or masses on exam.   Labs: unremarkable, no evidence of anemia.   Not concerned for emergent process taking place at this time. Not concerned for ectopic pregnancy, TOA, or ovarian torsion.  Emergent imaging not indicated at this time.  Pt is stable for discharge home. Will consult with OB/GYN about starting pt on OCP to help with heavy cycles. Will also consult with care management as pt states earliest OB/GYN appointment is 4 months out and she does not have a PCP.   Consulted with Dr. Nehemiah Settle, OB/GYN, recommended placing pt on Megace 40mg  daily, will advise pt that this will completely stop her cycles but may cause spotting and pt will likely start bleeding again once medication is discontinued.     Pt agreed to try megace, requested first dose in ED as she will f/u with [facility] tomorrow.  Discussed pt with Dr. Ralene Bathe who agrees with tx plan.    Noland Fordyce, PA-C 08/11/14 [place], MD 08/12/14 Quentin Mulling

## 2014-08-11 NOTE — Progress Notes (Signed)
ED CM received consult for assistance with f/u with [facility] and establishing primary care  .Reviewed patient's record, patient is uninsured without a PCP. Patient presented to [facility] ED with complaints of heavy vaginal bleeding that has worsened over several months . Discussed the [facility] and services rendered, patient agreeable to establish care at the [facility] Offered to schedule appointment for f/u to establish primary care, patient informed that PCP will need to provide a referral to GYN, CM will contact [facility] to contact patient concerning scheduling an appt. patient verbalized understanding and appreciation for the assistance. Appointment scheduled for Wed. 3/23 at 9:00am at [facility]. Patient informed to bring prescriptions received in the ED, he eligible to use [facility] tomorrow.  Verbalized understanding,teach back done. Discussed dsipositon plan with E[employer]. No furrher ED CM needs identified

## 2014-08-11 NOTE — ED Notes (Addendum)
Asked patient if she could give [place] a urine sample. Pt yelled about the wait time and yelled at me about how she was not going to give me a urine sample because the wait was too long. PA informed.

## 2014-08-11 NOTE — ED Notes (Signed)
Pt has been having extreme vaginal bleeding since yesterday. This is her normal menses. She normally has periods this heavy. She has already had to change clothes twice today. Goes through tampons and pads.

## 2014-08-12 ENCOUNTER — Ambulatory Visit: Payer: Self-pay | Attending: Family Medicine | Admitting: Family Medicine

## 2014-08-12 VITALS — BP 128/79 | HR 71 | Temp 97.8°F | Resp 16 | Ht 64.0 in | Wt 181.8 lb

## 2014-08-12 DIAGNOSIS — Z8742 Personal history of other diseases of the female genital tract: Secondary | ICD-10-CM

## 2014-08-12 DIAGNOSIS — N920 Excessive and frequent menstruation with regular cycle: Secondary | ICD-10-CM | POA: Insufficient documentation

## 2014-08-12 LAB — GC/CHLAMYDIA PROBE AMP (~~LOC~~) NOT AT ARMC
Chlamydia: NEGATIVE
Neisseria Gonorrhea: NEGATIVE

## 2014-08-12 NOTE — Progress Notes (Signed)
9 year hx of gradually worsening vaginal bleeding with associated lower abdominal cramping.  Patient states she has had a tubal ligation 16 years ago but no hysterectomy.  She reports she is using between 8-9 super tampons each fay and bleeds for approximately 2 weeks out of each month.

## 2014-08-12 NOTE — Patient Instructions (Signed)
Fill and continue megace for heavy bleeding Make appointment with financial people to arrange coverage for referral to GYN. When this in place make an appointment here with primary care doctor assigned.

## 2014-08-12 NOTE — Progress Notes (Signed)
Patient ID: Terri Hogan, female   DOB: [birth year], 49 y.o.   MRN: [id number] Complaint:  Heavy Vaginal Bleeding  Subjective:   Patient presents with 9 year history of heavy vaginal bleeding. She reports bleeding 14 days a month heavily.  She was seen in ED yesterday and prescribed Megace. She is here today to fil that prescription and establish care. She has no insurance currently. Last HBG in ED was 12+      ROS:  GEN:   Denies fever, chills,WT loss Skin:   Denies lesions or rashes HENT:   Denies  earache, epistaxis, sore throat, or neck pain, or headaches                LUNGS:  Denies SOB with rest or walking CV:   Denies CP or palpitations ABD:   Denies abdominal pain, nausea,and vomiting            EXT:    Denies muscle spasms or swelling; no pain in lower ext, no weakness NEURO:   Denies numbness or tingling, denies seizures  Objective:  Filed Vitals:   08/12/14 0916  BP: 128/79  Pulse: 71  Temp: 97.8 F (36.6 C)  Resp: 16  Height: 5\' 4"  (1.626 m)  Weight: 181 lb 12.8 oz (82.464 kg)  SpO2: 99%    Physical Exam:  General:  in no acute distress, Skin warm and dry.Marland Kitchen HEENT:  no pallor, no icterus, moist oral mucosa, no  Lymphadenopathy. Conjunctiva pink, tongue, pink, normal capillary refill Heart:   Normal  s1 &s2  Regular rate and rhythm, without M,G,R Lungs:   Clear to auscultation bilaterally. Abdomen:  Soft, nontender, nondistended, positive bowel sounds. Exetremeties:  No pedal edema,pedal pulses normal. Neuro:   Alert, awake, oriented x3, nonfocal.  Pertinent Lab Results:   Results for Terri, Hogan (MRN [id number]) as of 08/12/2014 09:07  Ref. Range 08/11/2014 14:40  WBC Latest Range: 4.0-10.5 K/uL 7.3  RBC Latest Range: 3.87-5.11 MIL/uL 4.54  Hemoglobin Latest Range: 12.0-15.0 g/dL 12.2  HCT Latest Range: 36.0-46.0 % 38.0  MCV Latest Range: 78.0-100.0 fL 83.7  MCH Latest Range: 26.0-34.0 pg 26.9  MCHC Latest Range: 30.0-36.0 g/dL 32.1  RDW  Latest Range: 11.5-15.5 % 15.0  Platelets Latest Range: 150-400 K/uL 427 (H)  Neutrophils Relative % Latest Range: 43-77 % 60  Lymphocytes Relative Latest Range: 12-46 % 33  Monocytes Relative Latest Range: 3-12 % 6  Eosinophils Relative Latest Range: 0-5 % 1  Basophils Relative Latest Range: 0-1 % 0  NEUT# Latest Range: 1.7-7.7 K/uL 4.3  Lymphocytes Absolute Latest Range: 0.7-4.0 K/uL 2.4  Monocytes Absolute Latest Range: 0.1-1.0 K/uL 0.4  Eosinophils Absolute Latest Range: 0.0-0.7 K/uL 0.1  Basophils Absolute Latest Range: 0.0-0.1 K/uL 0.0  Glucose Latest Range: 70-99 mg/dL 119 (H)          Medications: Prior to Admission medications   Medication Sig Start Date End Date Taking? Authorizing Provider  megestrol (MEGACE) 40 MG tablet Take 1 tablet (40 mg total) by mouth daily. 08/11/14  Yes Noland Fordyce, PA-C  naproxen (NAPROSYN) 500 MG tablet Take 1 tablet (500 mg total) by mouth 2 (two) times daily. 08/11/14  Yes Noland Fordyce, PA-C    Assessment: 1. Heavy Vaginal Bleeding 2  .   Plan: 1. Referral to financial, pharmacy, and GYN  Follow up:  The patient was given clear instructions to go to ER or return to medical center if symptoms don't improve, worsen or new problems develop. The  patient verbalized understanding.    Micheline Chapman, FNP,BC 08/12/2014, 9:21 AM

## 2014-08-19 ENCOUNTER — Ambulatory Visit (INDEPENDENT_AMBULATORY_CARE_PROVIDER_SITE_OTHER): Payer: Self-pay | Admitting: Obstetrics & Gynecology

## 2014-08-19 ENCOUNTER — Encounter: Payer: Self-pay | Admitting: Obstetrics & Gynecology

## 2014-08-19 VITALS — BP 134/73 | HR 90 | Temp 98.4°F | Ht 64.5 in | Wt 176.7 lb

## 2014-08-19 DIAGNOSIS — Z124 Encounter for screening for malignant neoplasm of cervix: Secondary | ICD-10-CM

## 2014-08-19 DIAGNOSIS — Z0000 Encounter for general adult medical examination without abnormal findings: Secondary | ICD-10-CM

## 2014-08-19 DIAGNOSIS — N920 Excessive and frequent menstruation with regular cycle: Secondary | ICD-10-CM

## 2014-08-19 DIAGNOSIS — Z1151 Encounter for screening for human papillomavirus (HPV): Secondary | ICD-10-CM

## 2014-08-19 NOTE — Progress Notes (Signed)
   Subjective:    Patient ID: Terri Hogan, female    DOB: [birth year], 49 y.o.   MRN: [id number]  HPI  49 yo P4 here today because she has been having "ridiculously" heavy periods that last about 12 days. She has had a BTL in the past.  Review of Systems She has never had a mammogram.    Objective:   Physical Exam   WNWHWFNAD Breathing and ambulating normally EG, vagina, cervix normal NSSA, NT, mobile, normal adnexal exam Hbg-12.2 (she is a smoker)         Assessment & Plan:  Menorrhagia- check TSH, gyn u/s Pap done today Schedule mammogram

## 2014-08-20 LAB — CYTOLOGY - PAP

## 2014-08-24 ENCOUNTER — Other Ambulatory Visit: Payer: Self-pay | Admitting: Obstetrics & Gynecology

## 2014-08-24 ENCOUNTER — Ambulatory Visit: Payer: Medicaid Other | Admitting: Family Medicine

## 2014-08-24 DIAGNOSIS — N920 Excessive and frequent menstruation with regular cycle: Secondary | ICD-10-CM

## 2014-08-25 ENCOUNTER — Ambulatory Visit (HOSPITAL_COMMUNITY)
Admission: RE | Admit: 2014-08-25 | Discharge: 2014-08-25 | Disposition: A | Discharge status: Home / Self Care | Payer: Self-pay | Source: Ambulatory Visit | Attending: Obstetrics & Gynecology | Admitting: Obstetrics & Gynecology

## 2014-08-25 ENCOUNTER — Other Ambulatory Visit: Payer: Self-pay

## 2014-08-25 DIAGNOSIS — R938 Abnormal findings on diagnostic imaging of other specified body structures: Secondary | ICD-10-CM | POA: Insufficient documentation

## 2014-08-25 DIAGNOSIS — N920 Excessive and frequent menstruation with regular cycle: Secondary | ICD-10-CM | POA: Insufficient documentation

## 2014-08-25 DIAGNOSIS — D251 Intramural leiomyoma of uterus: Secondary | ICD-10-CM | POA: Insufficient documentation

## 2014-08-25 LAB — TSH: TSH: 1.019 u[IU]/mL (ref 0.350–4.500)

## 2014-08-31 ENCOUNTER — Encounter: Payer: Self-pay | Admitting: Family Medicine

## 2014-08-31 ENCOUNTER — Ambulatory Visit: Payer: Self-pay | Attending: Obstetrics & Gynecology | Admitting: Family Medicine

## 2014-08-31 VITALS — BP 104/69 | HR 66 | Temp 98.2°F | Resp 16 | Ht 64.0 in | Wt 180.0 lb

## 2014-08-31 DIAGNOSIS — N920 Excessive and frequent menstruation with regular cycle: Secondary | ICD-10-CM

## 2014-08-31 DIAGNOSIS — D251 Intramural leiomyoma of uterus: Secondary | ICD-10-CM

## 2014-08-31 DIAGNOSIS — F17200 Nicotine dependence, unspecified, uncomplicated: Secondary | ICD-10-CM

## 2014-08-31 DIAGNOSIS — Z720 Tobacco use: Secondary | ICD-10-CM | POA: Insufficient documentation

## 2014-08-31 DIAGNOSIS — Z8742 Personal history of other diseases of the female genital tract: Secondary | ICD-10-CM

## 2014-08-31 DIAGNOSIS — F5102 Adjustment insomnia: Secondary | ICD-10-CM

## 2014-08-31 LAB — CBC
HCT: 37.4 % (ref 36.0–46.0)
Hemoglobin: 11.9 g/dL — ABNORMAL LOW (ref 12.0–15.0)
MCH: 26.4 pg (ref 26.0–34.0)
MCHC: 31.8 g/dL (ref 30.0–36.0)
MCV: 83.1 fL (ref 78.0–100.0)
MPV: 8.8 fL (ref 8.6–12.4)
PLATELETS: 507 10*3/uL — AB (ref 150–400)
RBC: 4.5 MIL/uL (ref 3.87–5.11)
RDW: 15.6 % — AB (ref 11.5–15.5)
WBC: 6.2 10*3/uL (ref 4.0–10.5)

## 2014-08-31 MED ORDER — MEDROXYPROGESTERONE ACETATE 10 MG PO TABS: 10.0000 mg | ORAL_TABLET | Freq: Every day | ORAL | Status: DC

## 2014-08-31 MED ORDER — TRAZODONE HCL 50 MG PO TABS: 25.0000 mg | ORAL_TABLET | Freq: Every evening | ORAL | Status: DC | PRN

## 2014-08-31 MED ORDER — FERROUS SULFATE 325 (65 FE) MG PO TABS: 325.0000 mg | ORAL_TABLET | Freq: Two times a day (BID) | ORAL | Status: DC

## 2014-08-31 NOTE — Progress Notes (Signed)
Establish Care F/U heavy menses

## 2014-08-31 NOTE — Patient Instructions (Addendum)
Terri Hogan,  Thank you for coming in today. It was a pleasure meeting you. I look forward to being your primary doctor.  1. Heavy periods with small fibroids: Stop megace Provera 10 mg daily  Start iron therapy. Take a stool softener and drink plenty of water if iron constipates you.  Repeat CBC today  2. Stress with insomnia: Trazodone 25-50 mg at night at least 30 minutes before bedtime   3. Smoking cessation support: smoking cessation hotline: 1-800-QUIT-NOW.  Smoking cessation classes are available through [facility]. Call [phone] or visit our website at [email or web address]  F/u in 6 weeks, after next cycle.   Dr. Adrian Blackwater   Fibroids Fibroids are lumps (tumors) that can occur any place in a woman's body. These lumps are not cancerous. Fibroids vary in size, weight, and where they grow. HOME CARE  Do not take aspirin.  Write down the number of pads or tampons you use during your period. Tell your doctor. This can help determine the best treatment for you. GET HELP RIGHT AWAY IF:  You have pain in your lower belly (abdomen) that is not helped with medicine.  You have cramps that are not helped with medicine.  You have more bleeding between or during your period.  You feel lightheaded or pass out (faint).  Your lower belly pain gets worse. MAKE SURE YOU:  Understand these instructions.  Will watch your condition.  Will get help right away if you are not doing well or get worse. Document Released: 06/10/2010 Document Revised: 07/31/2011 Document Reviewed: 06/10/2010 [facility] Patient Information 2015 [place], [place]. This information is not intended to replace advice given to you by your health care provider. Make sure you discuss any questions you have with your health care provider.  Menorrhagia Menorrhagia is when your menstrual periods are heavy or last longer than usual.  HOME CARE  Only take medicine as told by your  doctor.  Take any iron pills as told by your doctor. Heavy bleeding may cause low levels of iron in your body.  Do not take aspirin 1 week before or during your period. Aspirin can make the bleeding worse.  Lie down for a while if you change your tampon or pad more than once in 2 hours. This may help lessen the bleeding.  Eat a healthy diet and foods with iron. These foods include leafy green vegetables, meat, liver, eggs, and whole grain breads and cereals.  Do not try to lose weight. Wait until the heavy bleeding has stopped and your iron level is normal. GET HELP IF:  You soak through a pad or tampon every 1 or 2 hours, and this happens every time you have a period.  You need to use pads and tampons at the same time because you are bleeding so much.  You need to change your pad or tampon during the night.  You have a period that lasts for more than 8 days.  You pass clots bigger than 1 inch (2.5 cm) wide.  You have irregular periods that happen more or less often than once a month.  You feel dizzy or pass out (faint).  You feel very weak or tired.  You feel short of breath or feel your heart is beating too fast when you exercise.  You feel sick to your stomach (nausea) and you throw up (vomit) while you are taking your medicine.   You have watery poop (diarrhea) while you are taking your medicine.  You  have any problems that may be related to the medicine you are taking.  GET HELP RIGHT AWAY IF:  You soak through 4 or more pads or tampons in 2 hours.  You have any bleeding while you are pregnant. MAKE SURE YOU:   Understand these instructions.  Will watch your condition.  Will get help right away if you are not doing well or get worse. Document Released: 02/15/2008 Document Revised: 01/08/2013 Document Reviewed: 11/07/2012 [facility] Patient Information 2015 [place], [place]. This information is not intended to replace advice given to you by your health care  provider. Make sure you discuss any questions you have with your health care provider.

## 2014-09-01 NOTE — Progress Notes (Signed)
   Subjective:    Patient ID: Terri Hogan, female    DOB: Jun 21, 1965, 49 y.o.   MRN: [id number] CC: establish care, menorrhagia  HPI  1. Menorrhagia: still bleeding on megace. Bleeding has lightened. She had pelvic US. She has small fibroids. She is distressed by bleeding. She has fatigue, cold, poor sleep, stress.   Soc Hx: current smoker Med Hx: menorrhagia Surg Hx: s/p BTL  Review of Systems  Constitutional: Negative.   Respiratory: Negative.   Cardiovascular: Negative.   Gastrointestinal: Negative.   Endocrine: Positive for cold intolerance.  Genitourinary: Positive for vaginal bleeding, menstrual problem and pelvic pain. Negative for vaginal discharge and vaginal pain.  Skin: Negative.   Neurological: Negative.   Psychiatric/Behavioral: Positive for sleep disturbance and dysphoric mood. The patient is not nervous/anxious.       Objective:   Physical Exam BP 104/69 mmHg  Pulse 66  Temp(Src) 98.2 F (36.8 C) (Oral)  Resp 16  Ht 5\' 4"  (1.626 m)  Wt 180 lb (81.647 kg)  BMI 30.88 kg/m2  SpO2 100%  LMP 08/10/2014 General appearance: alert, cooperative and no distress Eyes: mild conjunctival pallor  Lungs: clear to auscultation bilaterally Heart: regular rate and rhythm, S1, S2 normal, no murmur, click, rub or gallop Abdomen: soft, non-tender; bowel sounds normal; no masses,  no organomegaly  Skin: warm and dry      Assessment & Plan:

## 2014-09-01 NOTE — Assessment & Plan Note (Signed)
Heavy periods with small fibroids: Stop megace Provera 10 mg daily  Start iron therapy. Take a stool softener and drink plenty of water if iron constipates you.  Repeat CBC today

## 2014-09-01 NOTE — Assessment & Plan Note (Signed)
Smoking cessation support: smoking cessation hotline: 1-800-QUIT-NOW.  Smoking cessation classes are available through [facility] and [facility]. Call [phone] or visit our website at [email or web address].   

## 2014-09-01 NOTE — Assessment & Plan Note (Signed)
Stress with insomnia: Trazodone 25-50 mg at night at least 30 minutes before bedtime

## 2014-09-02 ENCOUNTER — Telehealth: Payer: Self-pay | Admitting: General Practice

## 2014-09-02 ENCOUNTER — Encounter: Payer: Self-pay | Admitting: *Deleted

## 2014-09-02 ENCOUNTER — Telehealth: Payer: Self-pay | Admitting: *Deleted

## 2014-09-02 NOTE — Telephone Encounter (Signed)
Patient is calling stating that she is having a heavy menstrual cycle and she is having pain, she would like to speak to a nurse about her condition. Please f/u with pt.

## 2014-09-02 NOTE — Telephone Encounter (Signed)
Naproxen 500 mg TID or ibuprofen 600 mg TID recommended for menstrual pain. With food

## 2014-09-02 NOTE — Telephone Encounter (Signed)
Patient called in saying she was still having menstrual cramping and bleeding.  Per Dr. Adrian Blackwater patient was told to take Naproxen 500mg  tid or Ibuprofen 600mg  TID both with food to help relieve cramps.  Patient concerned about her condition and said she was meeting with Diane on 4/18 to apply for her orange card so that she could see a gynecologist.  Patient says she feels week and lightheaded.  When asked if she was eating and drinking she said that she had not had anything today.  Urged patient to drink at least 8 glasses of water or other non-carbonated drinks each day and to eat small, frequent, protein rich foods in order to maintain her strength.  Patient agreed.  Informed patient of walk in clinic if she felt she needed to be seen.  Patient appreciative and had no further questions.

## 2014-09-07 ENCOUNTER — Ambulatory Visit: Payer: Self-pay | Attending: Internal Medicine

## 2014-09-08 NOTE — Telephone Encounter (Signed)
error 

## 2014-10-07 ENCOUNTER — Emergency Department (HOSPITAL_COMMUNITY): Payer: Self-pay

## 2014-10-07 ENCOUNTER — Emergency Department (HOSPITAL_COMMUNITY)
Admission: EM | Admit: 2014-10-07 | Discharge: 2014-10-07 | Disposition: A | Discharge status: Home / Self Care | Payer: Self-pay | Attending: Emergency Medicine | Admitting: Emergency Medicine

## 2014-10-07 ENCOUNTER — Encounter (HOSPITAL_COMMUNITY): Payer: Self-pay | Admitting: Family Medicine

## 2014-10-07 DIAGNOSIS — Z791 Long term (current) use of non-steroidal anti-inflammatories (NSAID): Secondary | ICD-10-CM | POA: Insufficient documentation

## 2014-10-07 DIAGNOSIS — Z79899 Other long term (current) drug therapy: Secondary | ICD-10-CM | POA: Insufficient documentation

## 2014-10-07 DIAGNOSIS — G43909 Migraine, unspecified, not intractable, without status migrainosus: Secondary | ICD-10-CM | POA: Insufficient documentation

## 2014-10-07 DIAGNOSIS — M549 Dorsalgia, unspecified: Secondary | ICD-10-CM | POA: Insufficient documentation

## 2014-10-07 DIAGNOSIS — M436 Torticollis: Secondary | ICD-10-CM | POA: Insufficient documentation

## 2014-10-07 DIAGNOSIS — H9209 Otalgia, unspecified ear: Secondary | ICD-10-CM | POA: Insufficient documentation

## 2014-10-07 DIAGNOSIS — Z720 Tobacco use: Secondary | ICD-10-CM | POA: Insufficient documentation

## 2014-10-07 MED ORDER — FENTANYL CITRATE (PF) 100 MCG/2ML IJ SOLN
50.0000 ug | Freq: Once | INTRAMUSCULAR | Status: AC
  Administered 2014-10-07: 50 ug via NASAL

## 2014-10-07 MED ORDER — PREDNISONE 10 MG PO TABS: 20.0000 mg | ORAL_TABLET | Freq: Every day | ORAL | Status: DC

## 2014-10-07 MED ORDER — DIPHENHYDRAMINE HCL 25 MG PO CAPS
50.0000 mg | ORAL_CAPSULE | Freq: Once | ORAL | Status: AC
  Administered 2014-10-07: 50 mg via ORAL
  Filled 2014-10-07: qty 2

## 2014-10-07 MED ORDER — FENTANYL CITRATE (PF) 100 MCG/2ML IJ SOLN
INTRAMUSCULAR | Status: AC
  Filled 2014-10-07: qty 2

## 2014-10-07 MED ORDER — PREDNISONE 20 MG PO TABS
40.0000 mg | ORAL_TABLET | Freq: Once | ORAL | Status: AC
  Administered 2014-10-07: 40 mg via ORAL
  Filled 2014-10-07: qty 2

## 2014-10-07 MED ORDER — CYCLOBENZAPRINE HCL 10 MG PO TABS
10.0000 mg | ORAL_TABLET | Freq: Once | ORAL | Status: AC
Start: 2014-10-07 — End: 2014-10-07
  Administered 2014-10-07: 10 mg via ORAL
  Filled 2014-10-07: qty 1

## 2014-10-07 MED ORDER — OXYCODONE-ACETAMINOPHEN 5-325 MG PO TABS
1.0000 | ORAL_TABLET | Freq: Once | ORAL | Status: AC
Start: 2014-10-07 — End: 2014-10-07
  Administered 2014-10-07: 1 via ORAL
  Filled 2014-10-07: qty 1

## 2014-10-07 MED ORDER — KETOROLAC TROMETHAMINE 60 MG/2ML IM SOLN
60.0000 mg | Freq: Once | INTRAMUSCULAR | Status: AC
  Administered 2014-10-07: 60 mg via INTRAMUSCULAR
  Filled 2014-10-07: qty 2

## 2014-10-07 MED ORDER — OXYCODONE-ACETAMINOPHEN 5-325 MG PO TABS: 1.0000 | ORAL_TABLET | ORAL | Status: DC | PRN

## 2014-10-07 MED ORDER — CYCLOBENZAPRINE HCL 10 MG PO TABS: 10.0000 mg | ORAL_TABLET | Freq: Two times a day (BID) | ORAL | Status: DC | PRN

## 2014-10-07 NOTE — Discharge Instructions (Signed)
You make take ibuprofen and Benadryl in addition to the other medications. Follow up with Dr. Berenice Primas for further evaluation.

## 2014-10-07 NOTE — ED Provider Notes (Signed)
CSN: [id number]     Arrival date & time 10/07/14  1123 History  This chart was scribed for Debroah Baller, NP working with Ernestina Patches, MD by Mercy Moore, ED Scribe. This patient was seen in room TR01C/TR01C and the patient's care was started at 1:11 PM.   Chief Complaint  Patient presents with  . Neck Pain   The history is provided by the patient. No language interpreter was used.   HPI Comments: PERLINE AWE is a 49 y.o. female who presents to the Emergency Department complaining of severe neck pain after wakening this morning. Patient reports intermittent frontal headache for two weeks now which radiates onto her ear and temporal area. This morning patient reports wakening with neck pain and headache that she rated 10/10 and reports sensation of pop and pull in the back of her neck when attempting to get out of bed. Patient reports some alleviation of her neck pain with gripping the posterior aspect and reports increased pain upon release. Patient reports exacerbation of her neck pain with movement especially lateral rotation and lying down. Patient reports treatment with Aleve that does provide temporary relief of her headache. Patient shares history of remote migraine in the past, but states her recent headaches are not consistent this headache is no where near as bad. Patient denies fever, chills, nausea, vomiting, urinary symptoms, visual disturbance, photophobia. No bladder/bowel incontinence.    History reviewed. No pertinent past medical history. Past Surgical History  Procedure Laterality Date  . Carpal tunnel release    . Cholecystectomy    . Tubal ligation     Family History  Problem Relation Age of Onset  . Diabetes Mother   . Cancer Father    History  Substance Use Topics  . Smoking status: Current Every Day Smoker  . Smokeless tobacco: Not on file  . Alcohol Use: No   OB History    Gravida Para Term Preterm AB TAB SAB Ectopic Multiple Living   4 4 4  0 0  0   3      Review of Systems  Constitutional: Negative for fever and chills.  HENT: Positive for ear pain. Negative for congestion, rhinorrhea and sore throat.   Eyes: Negative for photophobia and visual disturbance.  Respiratory: Negative for cough and shortness of breath.   Gastrointestinal: Negative for nausea, vomiting, abdominal pain and diarrhea.  Endocrine: Negative for polyuria.  Genitourinary: Negative for dysuria and hematuria.  Musculoskeletal: Positive for back pain and neck pain. Negative for joint swelling.  Skin: Negative for rash.  Allergic/Immunologic: Negative for immunocompromised state.  Neurological: Positive for headaches. Negative for light-headedness.  Hematological: Does not bruise/bleed easily.  Psychiatric/Behavioral: Negative for confusion.      Allergies  Codeine  Home Medications   Prior to Admission medications   Medication Sig Start Date End Date Taking? Authorizing Provider  cyclobenzaprine (FLEXERIL) 10 MG tablet Take 1 tablet (10 mg total) by mouth 2 (two) times daily as needed for muscle spasms. 10/07/14   Niyam Bisping Bunnie Pion, NP  ferrous sulfate 325 (65 FE) MG tablet Take 1 tablet (325 mg total) by mouth 2 (two) times daily with a meal. Patient not taking: Reported on 10/07/2014 08/31/14   Boykin Nearing, MD  medroxyPROGESTERone (PROVERA) 10 MG tablet Take 1 tablet (10 mg total) by mouth daily. Patient not taking: Reported on 10/07/2014 08/31/14   Boykin Nearing, MD  naproxen (NAPROSYN) 500 MG tablet Take 1 tablet (500 mg total) by mouth 2 (two) times daily.  Patient not taking: Reported on 10/07/2014 08/11/14   Noland Fordyce, PA-C  oxyCODONE-acetaminophen (ROXICET) 5-325 MG per tablet Take 1 tablet by mouth every 4 (four) hours as needed for severe pain. 10/07/14   Ashmi Blas Bunnie Pion, NP  predniSONE (DELTASONE) 10 MG tablet Take 2 tablets (20 mg total) by mouth daily. 10/07/14   Seyon Strader Bunnie Pion, NP  traZODone (DESYREL) 50 MG tablet Take 0.5-1 tablets (25-50 mg total) by mouth  at bedtime as needed for sleep. Patient not taking: Reported on 10/07/2014 08/31/14   Boykin Nearing, MD   Triage Vitals: BP 142/88 mmHg  Pulse 88  Temp(Src) 98.2 F (36.8 C)  Resp 18  SpO2 100% Physical Exam  Constitutional: She is oriented to person, place, and time. She appears well-developed and well-nourished. No distress.  HENT:  Head: Normocephalic and atraumatic.  Right Ear: Tympanic membrane normal.  Left Ear: Tympanic membrane normal.  Nose: Nose normal.  Mouth/Throat: Uvula is midline, oropharynx is clear and moist and mucous membranes are normal.  Eyes: Conjunctivae and EOM are normal.  Neck: Trachea normal and normal range of motion. Neck supple. Spinous process tenderness and muscular tenderness present.  Cardiovascular: Normal rate and regular rhythm.   Pulmonary/Chest: Effort normal. She has no wheezes. She has no rales.  Abdominal: Soft. Bowel sounds are normal. She exhibits no mass. There is no tenderness.  Musculoskeletal: Normal range of motion. She exhibits no edema.  Radial and pedal pulses strong, adequate circulation, good touch sensation.  Neurological: She is alert and oriented to person, place, and time. No cranial nerve deficit or sensory deficit. She displays a negative Romberg sign. Gait normal.  Reflex Scores:      Bicep reflexes are 2+ on the right side and 2+ on the left side.      Brachioradialis reflexes are 2+ on the right side and 2+ on the left side.      Patellar reflexes are 2+ on the right side and 2+ on the left side.      Achilles reflexes are 2+ on the right side and 2+ on the left side. Rapid alternating movement without difficulty. Right grip slightly less than left due to severe pain.  Skin: Skin is warm and dry.  Psychiatric: She has a normal mood and affect. Her behavior is normal.  Nursing note and vitals reviewed.   ED Course  Procedures (including critical care time)  COORDINATION OF CARE: 1:19 PM- Will order CT of neck.  Discussed treatment plan with patient at bedside and patient agreed to plan.   Labs Review Labs Reviewed - No data to display  Imaging Review Ct Cervical Spine Wo Contrast  10/07/2014   CLINICAL DATA:  Severe  neck pain starting this morning  EXAM: CT CERVICAL SPINE WITHOUT CONTRAST  TECHNIQUE: Multidetector CT imaging of the cervical spine was performed without intravenous contrast. Multiplanar CT image reconstructions were also generated.  COMPARISON:  None.  FINDINGS: Axial images of the cervical spine shows no acute fracture or subluxation. There is no pneumothorax in visualized lung apices.  Computer processed images shows no acute fracture or subluxation. Degenerative changes are noted C1-C2 articulation. There is minimal disc space flattening with anterior spurring at C4-C5 level. Mild disc space flattening with mild anterior spurring at C5-C6 level. Minimal disc space flattening with mild anterior spurring at C6-C7 level. No prevertebral soft tissue swelling. Cervical airway is patent. No significant narrowing of neural foramina noted. Spinal canal is patent.  IMPRESSION: No cervical spine acute fracture  or subluxation. Mild degenerative changes as described above.   Electronically Signed   By: Lahoma Crocker M.D.   On: 10/07/2014 14:11     MDM  49 y.o. female with cervical spine tenderness and muscular tenderness and spasm. Doubt infectious process since the onset was sudden when waking this am and she has not fever or other symptoms. Stable for d/c without focal neuro deficits. Discussed with the patient clinical findings and plan of care and all questioned fully answered. She agrees with plan. She will return if any problems arise.  Final diagnoses:  Torticollis, acute   I personally performed the services described in this documentation, which was scribed in my presence. The recorded information has been reviewed and is accurate.    [address] [place], [place] 10/07/14 1609  Ernestina Patches,  MD 10/07/14 2021

## 2014-10-07 NOTE — ED Notes (Signed)
Pt here for neck pain. sts she heard something pop this am and now she can barely move her neck.

## 2014-11-24 ENCOUNTER — Emergency Department (HOSPITAL_COMMUNITY)
Admission: EM | Admit: 2014-11-24 | Discharge: 2014-11-24 | Disposition: A | Discharge status: Home / Self Care | Payer: Self-pay | Attending: Emergency Medicine | Admitting: Emergency Medicine

## 2014-11-24 ENCOUNTER — Encounter (HOSPITAL_COMMUNITY): Payer: Self-pay | Admitting: Emergency Medicine

## 2014-11-24 DIAGNOSIS — R42 Dizziness and giddiness: Secondary | ICD-10-CM | POA: Insufficient documentation

## 2014-11-24 DIAGNOSIS — Z720 Tobacco use: Secondary | ICD-10-CM | POA: Insufficient documentation

## 2014-11-24 DIAGNOSIS — Z9049 Acquired absence of other specified parts of digestive tract: Secondary | ICD-10-CM | POA: Insufficient documentation

## 2014-11-24 DIAGNOSIS — N939 Abnormal uterine and vaginal bleeding, unspecified: Secondary | ICD-10-CM | POA: Insufficient documentation

## 2014-11-24 LAB — I-STAT CHEM 8, ED
BUN: 13 mg/dL (ref 6–20)
CREATININE: 0.6 mg/dL (ref 0.44–1.00)
Calcium, Ion: 1.13 mmol/L (ref 1.12–1.23)
Chloride: 102 mmol/L (ref 101–111)
Glucose, Bld: 115 mg/dL — ABNORMAL HIGH (ref 65–99)
HCT: 43 % (ref 36.0–46.0)
HEMOGLOBIN: 14.6 g/dL (ref 12.0–15.0)
Potassium: 4.6 mmol/L (ref 3.5–5.1)
Sodium: 138 mmol/L (ref 135–145)
TCO2: 24 mmol/L (ref 0–100)

## 2014-11-24 LAB — I-STAT BETA HCG BLOOD, ED (MC, WL, AP ONLY): I-stat hCG, quantitative: <5 m[IU]/mL (ref <5)

## 2014-11-24 MED ORDER — IBUPROFEN 800 MG PO TABS
800.0000 mg | ORAL_TABLET | Freq: Once | ORAL | Status: AC
  Administered 2014-11-24: 800 mg via ORAL
  Filled 2014-11-24: qty 1

## 2014-11-24 MED ORDER — IBUPROFEN 800 MG PO TABS: 800.0000 mg | ORAL_TABLET | Freq: Three times a day (TID) | ORAL | Status: DC

## 2014-11-24 MED ORDER — SODIUM CHLORIDE 0.9 % IV BOLUS (SEPSIS)
1000.0000 mL | Freq: Once | INTRAVENOUS | Status: AC
  Administered 2014-11-24: 1000 mL via INTRAVENOUS

## 2014-11-24 NOTE — ED Notes (Signed)
Discharge instructions reviewed with patient. No questions or concerns upon discharge. Pt to discharge via wheelchair. VSS NAD upon d/c

## 2014-11-24 NOTE — ED Provider Notes (Signed)
CSN: [id number]     Arrival date & time 11/24/14  0945 History   First MD Initiated Contact with Patient 11/24/14 [phone]     Chief Complaint  Patient presents with  . Vaginal Bleeding  . Dizziness     (Consider location/radiation/quality/duration/timing/severity/associated sxs/prior Treatment) HPI Terri Hogan is a 49 y.o. female with a history of cholecystectomy, tubal ligation comes in for evaluation of dizziness and vaginal bleeding. Patient states she has had irregular menses for the past 16 years, has been evaluated by GYN and it is recommended that she have a hysterectomy. She denies any symptoms, vaginal discharge or pelvic pain with this. She does report associated abdominal cramps that she rates as an 8/10. She has not tried anything to improve the symptoms. She will follow-up with GYN once her insurance is up-to-date. Reports she was prescribed iron pills due to her vaginal bleeding, but she stopped taking these pills. She also reports intermittent dizziness but is exacerbated when she stands up. States when this happens and she gets up quickly she "feels swimmy headed with blurred vision". She denies headache, chest pain, shortness of breath, numbness or weakness, urinary symptoms, change in bowel habits. History reviewed. No pertinent past medical history. Past Surgical History  Procedure Laterality Date  . Carpal tunnel release    . Cholecystectomy    . Tubal ligation     Family History  Problem Relation Age of Onset  . Diabetes Mother   . Cancer Father    History  Substance Use Topics  . Smoking status: Current Every Day Smoker -- 1.00 packs/day    Types: Cigarettes  . Smokeless tobacco: Not on file  . Alcohol Use: No   OB History    Gravida Para Term Preterm AB TAB SAB Ectopic Multiple Living   4 4 4  0 0  0   3     Review of Systems A 10 point review of systems was completed and was negative except for pertinent positives and negatives as mentioned in the history of  present illness     Allergies  Codeine  Home Medications   Prior to Admission medications   Medication Sig Start Date End Date Taking? Authorizing Provider  ibuprofen (ADVIL,MOTRIN) 200 MG tablet Take 400 mg by mouth every 6 (six) hours as needed for mild pain, moderate pain or cramping.   Yes Historical Provider, MD  cyclobenzaprine (FLEXERIL) 10 MG tablet Take 1 tablet (10 mg total) by mouth 2 (two) times daily as needed for muscle spasms. Patient not taking: Reported on 11/24/2014 10/07/14   Ashley Murrain, NP  ferrous sulfate 325 (65 FE) MG tablet Take 1 tablet (325 mg total) by mouth 2 (two) times daily with a meal. Patient not taking: Reported on 10/07/2014 08/31/14   Boykin Nearing, MD  medroxyPROGESTERone (PROVERA) 10 MG tablet Take 1 tablet (10 mg total) by mouth daily. Patient not taking: Reported on 10/07/2014 08/31/14   Boykin Nearing, MD  naproxen (NAPROSYN) 500 MG tablet Take 1 tablet (500 mg total) by mouth 2 (two) times daily. Patient not taking: Reported on 10/07/2014 08/11/14   Noland Fordyce, PA-C  oxyCODONE-acetaminophen (ROXICET) 5-325 MG per tablet Take 1 tablet by mouth every 4 (four) hours as needed for severe pain. Patient not taking: Reported on 11/24/2014 10/07/14   Ashley Murrain, NP  predniSONE (DELTASONE) 10 MG tablet Take 2 tablets (20 mg total) by mouth daily. Patient not taking: Reported on 11/24/2014 10/07/14   Ashley Murrain, NP  traZODone (DESYREL) 50 MG tablet Take 0.5-1 tablets (25-50 mg total) by mouth at bedtime as needed for sleep. Patient not taking: Reported on 10/07/2014 08/31/14   Josalyn Funches, MD   BP 121/70 mmHg  Pulse 69  Temp(Src) 97.9 F (36.6 C) (Oral)  Ht 5\' 4"  (1.626 m)  Wt 170 lb (77.111 kg)  BMI 29.17 kg/m2  SpO2 100%  LMP  Physical Exam  Constitutional: She is oriented to person, place, and time. She appears well-developed and well-nourished. No distress.  HENT:  Head: Normocephalic and atraumatic.  Dry mucous membranes.  Eyes:  Conjunctivae are normal. Pupils are equal, round, and reactive to light. Right eye exhibits no discharge. Left eye exhibits no discharge. No scleral icterus.  Neck: Neck supple.  Cardiovascular: Normal rate, regular rhythm and normal heart sounds.   Pulmonary/Chest: Effort normal and breath sounds normal. No respiratory distress. She has no wheezes. She has no rales.  Abdominal: Soft. She exhibits no distension and no mass. There is no tenderness. There is no rebound and no guarding.  Musculoskeletal: Normal range of motion. She exhibits no edema or tenderness.  Neurological: She is alert and oriented to person, place, and time.  Cranial Nerves II-XII grossly intact. Moves all extremities without ataxia.  Skin: Skin is warm and dry. No rash noted.  Psychiatric: She has a normal mood and affect.  Nursing note and vitals reviewed.   ED Course  Procedures (including critical care time) Labs Review Labs Reviewed  I-STAT CHEM 8, ED - Abnormal; Notable for the following:    Glucose, Bld 115 (*)    All other components within normal limits  I-STAT BETA HCG BLOOD, ED ([facility], [facility], [facility] ONLY)    Imaging Review No results found.   EKG Interpretation None     Meds given in ED:  Medications  sodium chloride 0.9 % bolus 1,000 mL (0 mLs Intravenous Stopped 11/24/14 1117)  ibuprofen (ADVIL,MOTRIN) tablet 800 mg (800 mg Oral Given 11/24/14 1146)    New Prescriptions   No medications on file   Filed Vitals:   11/24/14 1003 11/24/14 1015 11/24/14 1030  BP: 134/66 131/67 121/70  Pulse:  75 69  Temp: 97.9 F (36.6 C)    TempSrc: Oral    Height: 5\' 4"  (1.626 m)    Weight: 170 lb (77.111 kg)    SpO2: 100% 100% 100%    MDM  Vitals stable - WNL -afebrile Pt resting comfortably in ED. given IV fluids and is tolerating POfluids. PE--normal neuro exam. Benign abdominal exam. Patient ambulates in the hall without any difficulty. Mildly dry mucous membranes. Labwork--no evidence of anemia,  electrolyte imbalance or other hematologic abnormality.  DDX--patient's vaginal bleeding is a chronic problem and is not new for her. There are no new symptoms associated with this problem. She is followed by her GYN for this problem and will be able to follow-up. Dizziness does not appear to be associated with any acute or emergent pathology at this time. Doubt central lesion. Likely associated with dehydration. Will DC with Motrin for abdominal cramps. I discussed all relevant lab findings and imaging results with pt and they verbalized understanding. Discussed f/u with PCP within 48 hrs and return precautions, pt very amenable to plan.  Final diagnoses:  Dizzy spells  Vaginal bleeding        Comer Locket, PA-C 11/24/14 [place], MD 11/25/14 [phone]

## 2014-11-24 NOTE — ED Notes (Addendum)
Orthostatic Vitals:  Lying:  BP:119/63 HR 66  Sitting: Pt states that she is dizzy with sitting up BP: BP: 120/71 HR: 68  Standing: BP: 113/63 HR: 73

## 2014-11-24 NOTE — ED Notes (Signed)
Pt states that she needs a hysterectomy and c/o dizziness and vaginal bleeding. Onset Friday. Issues started 10 years ago. Pt states she has been getting insurance established.

## 2014-11-24 NOTE — Discharge Instructions (Signed)
You were evaluated in the ED today for your dizziness. There does not appear to be an emergent cause your symptoms at this time. Please follow-up with your doctors for further evaluation and management of your symptoms. It is also important to follow-up with her GYN for further evaluation of your vaginal bleeding. Return to ED for new or worsening symptoms. Abnormal Uterine Bleeding Abnormal uterine bleeding can affect women at various stages in life, including teenagers, women in their reproductive years, pregnant women, and women who have reached menopause. Several kinds of uterine bleeding are considered abnormal, including:  Bleeding or spotting between periods.   Bleeding after sexual intercourse.   Bleeding that is heavier or more than normal.   Periods that last longer than usual.  Bleeding after menopause.  Many cases of abnormal uterine bleeding are minor and simple to treat, while others are more serious. Any type of abnormal bleeding should be evaluated by your health care provider. Treatment will depend on the cause of the bleeding. HOME CARE INSTRUCTIONS Monitor your condition for any changes. The following actions may help to alleviate any discomfort you are experiencing:  Avoid the use of tampons and douches as directed by your health care provider.  Change your pads frequently. You should get regular pelvic exams and Pap tests. Keep all follow-up appointments for diagnostic tests as directed by your health care provider.  SEEK MEDICAL CARE IF:   Your bleeding lasts more than 1 week.   You feel dizzy at times.  SEEK IMMEDIATE MEDICAL CARE IF:   You pass out.   You are changing pads every 15 to 30 minutes.   You have abdominal pain.  You have a fever.   You become sweaty or weak.   You are passing large blood clots from the vagina.   You start to feel nauseous and vomit. MAKE SURE YOU:   Understand these instructions.  Will watch your  condition.  Will get help right away if you are not doing well or get worse. Document Released: 05/08/2005 Document Revised: 05/13/2013 Document Reviewed: 12/05/2012 [facility] Patient Information 2015 [place], [place]. This information is not intended to replace advice given to you by your health care provider. Make sure you discuss any questions you have with your health care provider.  Abdominal Hysterectomy Abdominal hysterectomy is a surgical procedure to remove your womb (uterus). Your uterus is the muscular organ that contains a developing baby. This surgery is done for many reasons. You may need an abdominal hysterectomy if you have cancer, growths (tumors), long-term pain, or bleeding. You may also have this procedure if your uterus has slipped down into your vagina (uterine prolapse). Depending on why you need an abdominal hysterectomy, you may also have other reproductive organs removed. These could include the part of your vagina that connects with your uterus (cervix), the organs that make eggs (ovaries), and the tubes that connect the ovaries to the uterus (fallopian tubes). LET [facility] CARE PROVIDER KNOW ABOUT:   Any allergies you have.  All medicines you are taking, including vitamins, herbs, eye drops, creams, and over-the-counter medicines.  Previous problems you or members of your family have had with the use of anesthetics.  Any blood disorders you have.  Previous surgeries you have had.  Medical conditions you have. RISKS AND COMPLICATIONS Generally, this is a safe procedure. However, as with any procedure, problems can occur. Infection is the most common problem after an abdominal hysterectomy. Other possible problems include:  Bleeding.  Formation  of blood clots that may break free and travel to your lungs.  Injury to other organs near your uterus.  Nerve injury causing nerve pain.  Decreased interest in sex or pain during sexual intercourse. BEFORE THE  PROCEDURE  Abdominal hysterectomy is a major surgical procedure. It can affect the way you feel about yourself. Talk to your health care provider about the physical and emotional changes hysterectomy may cause.  You may need to have blood work and X-rays done before surgery.  Quit smoking if you smoke. Ask your health care provider for help if you are struggling to quit.  Stop taking medicines that thin your blood as directed by your health care provider.  You may be instructed to take antibiotic medicines or laxatives before surgery.  Do not eat or drink anything for 6-8 hours before surgery.  Take your regular medicines with a small sip of water.  Bathe or shower the night or morning before surgery. PROCEDURE  Abdominal hysterectomy is done in the operating room at the hospital.  In most cases, you will be given a medicine that makes you go to sleep (general anesthetic).  The surgeon will make a cut (incision) through the skin in your lower belly.  The incision may be about 5-7 inches long. It may go side-to-side or up-and-down.  The surgeon will move aside the body tissue that covers your uterus. The surgeon will then carefully take out your uterus along with any of your other reproductive organs that need to be removed.  Bleeding will be controlled with clamps or sutures.  The surgeon will close your incision with sutures or metal clips. AFTER THE PROCEDURE  You will have some pain immediately after the procedure.  You will be given pain medicine in the recovery room.  You will be taken to your hospital room when you have recovered from the anesthesia.  You may need to stay in the hospital for 2-5 days.  You will be given instructions for recovery at home. Document Released: 05/13/2013 Document Reviewed: 05/13/2013 [facility] Patient Information 2015 [place], [place]. This information is not intended to replace advice given to you by your health care provider. Make sure  you discuss any questions you have with your health care provider.

## 2014-11-27 ENCOUNTER — Encounter: Payer: Self-pay | Admitting: Family Medicine

## 2014-11-27 ENCOUNTER — Ambulatory Visit: Payer: Self-pay | Attending: Family Medicine | Admitting: Family Medicine

## 2014-11-27 VITALS — BP 125/81 | HR 74 | Temp 98.2°F | Ht 66.0 in | Wt 180.0 lb

## 2014-11-27 DIAGNOSIS — Z720 Tobacco use: Secondary | ICD-10-CM

## 2014-11-27 DIAGNOSIS — G4700 Insomnia, unspecified: Secondary | ICD-10-CM | POA: Insufficient documentation

## 2014-11-27 DIAGNOSIS — F419 Anxiety disorder, unspecified: Secondary | ICD-10-CM | POA: Insufficient documentation

## 2014-11-27 DIAGNOSIS — F17200 Nicotine dependence, unspecified, uncomplicated: Secondary | ICD-10-CM | POA: Insufficient documentation

## 2014-11-27 DIAGNOSIS — F5102 Adjustment insomnia: Secondary | ICD-10-CM

## 2014-11-27 DIAGNOSIS — Z8742 Personal history of other diseases of the female genital tract: Secondary | ICD-10-CM

## 2014-11-27 DIAGNOSIS — R5383 Other fatigue: Secondary | ICD-10-CM | POA: Insufficient documentation

## 2014-11-27 DIAGNOSIS — N920 Excessive and frequent menstruation with regular cycle: Secondary | ICD-10-CM | POA: Insufficient documentation

## 2014-11-27 MED ORDER — ESCITALOPRAM OXALATE 5 MG PO TABS: 5.0000 mg | ORAL_TABLET | Freq: Every day | ORAL | Status: DC

## 2014-11-27 MED ORDER — TRAZODONE HCL 50 MG PO TABS: 25.0000 mg | ORAL_TABLET | Freq: Every evening | ORAL | Status: DC | PRN

## 2014-11-27 MED ORDER — MEDROXYPROGESTERONE ACETATE 10 MG PO TABS: 10.0000 mg | ORAL_TABLET | Freq: Every day | ORAL | Status: DC

## 2014-11-27 NOTE — Assessment & Plan Note (Signed)
Heavy menstrual bleeding: Restart provera take 10 mg for 5-10 days to stop menstrual bleeding, you may bleed again within 3 to 7 days after stopping provera, but it should be shorter and lighter.

## 2014-11-27 NOTE — Assessment & Plan Note (Signed)
Anxious and depressed mood with insomnia: lexapro 5 mg daily  Trazodone at night

## 2014-11-27 NOTE — Progress Notes (Signed)
   Subjective:    Patient ID: Terri Hogan, female    DOB: Mar 21, 1966, 49 y.o.   MRN: [id number] CC: heavy menstrual bleeding, fatigue and anxiety  HPI 49 yo F with hx of abnormal uterine bleeding, insomnia and anxiety  1. Abnormal uterine bleeding: bleeding daily. Started light, then heavy, then light again. Bleeding stopped for 2 months after provera has started back a few weeks ago. She reports dizziness and fatigue. She went to ED. Hgb stable.   2. Anxiety: with insomnia and depressed mood. Stress about poor finances, her son's girlfriend died of opiod OD and her son's friend hung himself. She reports trazodone made her feel sleepy, a bit dizzy, did not keep her asleep. She reports being treated in the past with zoloft, ambien, xanax, adderal, klonopin.   Soc Hx: current smoker  Review of Systems  Constitutional: Positive for fatigue. Negative for fever and chills.  Respiratory: Negative for shortness of breath.   Cardiovascular: Negative for chest pain.  Gastrointestinal: Negative for abdominal pain and blood in stool.  Genitourinary: Positive for vaginal bleeding and menstrual problem. Negative for vaginal discharge and vaginal pain.  Skin: Negative for rash.  Psychiatric/Behavioral: Positive for sleep disturbance and dysphoric mood. Negative for suicidal ideas, hallucinations, behavioral problems, confusion, self-injury and agitation. The patient is nervous/anxious. The patient is not hyperactive.       Objective:   Physical Exam BP 125/81 mmHg  Pulse 74  Temp(Src) 98.2 F (36.8 C)  Ht 5\' 6"  (1.676 m)  Wt 180 lb (81.647 kg)  BMI 29.07 kg/m2  SpO2 100%  LMP 11/20/2014 General appearance: alert, cooperative and no distress  Lungs: normal WOB Skin: Skin color, texture, turgor normal. No rashes or lesions  Affect: calm, alert, oriented   Depression screen [facility] 2/9 11/27/2014 08/31/2014  Decreased Interest 3 0  Down, Depressed, Hopeless 2 0  PHQ - 2 Score 5 0  Altered sleeping  3 -  Tired, decreased energy 3 -  Change in appetite 3 -  Feeling bad or failure about yourself  0 -  Trouble concentrating 3 -  Moving slowly or fidgety/restless 2 -  Suicidal thoughts 0 -  PHQ-9 Score 19 -       Assessment & Plan:

## 2014-11-27 NOTE — Assessment & Plan Note (Signed)
Smoking cessation support: smoking cessation hotline: 1-800-QUIT-NOW.  Smoking cessation classes are available through [facility]. Call [phone] or visit our website at [email or web address].   

## 2014-11-27 NOTE — Progress Notes (Signed)
ASSESSMENT: Pt currently experiencing symptoms of anxiety and depression, increased as a result of stress and insomnia. Pt needs to f/u with PCP and [facility]. Pt would benefit from community resources, as well as psychoeducation and supportive counseling to cope with symptoms of anxiety and depression.  Stage of Change: contemplative  PLAN: 1. F/U with behavioral health consultant in as needed 2. Psychiatric Medications: lexapro. 3. Behavioral recommendation(s):   -Go to [facility] walk-in -Consider reading over educational materials regarding coping with symptoms of anxiety and depression -Consider making an appointment with [facility] for legal advice -Consider [facility] for family counseling SUBJECTIVE: Pt. referred by Dr Adrian Blackwater for symptoms of anxiety and depression:  Pt. here for referral regarding symptoms of anxiety and depression.  Pt. reports the following symptoms/concerns: Pt reports that she has been having an increase in anxiety, including panic attacks, as well as feeling overwhelmed with life stressors. Her teenaged children have both experienced loss of friends recently, and she is finding she is unable to sleep. Duration of problem: >two months Severity: moderate  OBJECTIVE: Orientation & Cognition: Oriented x3. Thought processes normal and appropriate to situation. Mood: appropriate. Affect: appropriate Appearance: appropriate Risk of harm to self or others: no risk of harm to self or others Substance use: none Psychiatric medication use: Unchanged from prior contact. Assessments administered: risk assessment, none noted  Diagnosis: Insomnia due to stress CPT Code: F51.02 -------------------------------------------- Other(s) present in the room: none  Time spent with patient in exam room: 20 minutes minutes

## 2014-11-27 NOTE — Patient Instructions (Addendum)
Terri Hogan,  Thank you for coming in today  1. Anxious and depressed mood with insomnia: lexapro 5 mg daily  Trazodone at night   2. Heavy menstrual bleeding: Restart provera take 10 mg for 5-10 days to stop menstrual bleeding, you may bleed again within 3 to 7 days after stopping provera, but it should be shorter and lighter.   3. Smoking cessation support: smoking cessation hotline: 1-800-QUIT-NOW.  Smoking cessation classes are available through [facility]. Call [phone] or visit our website at [email or web address]  F/u in 3-4 weeks to assess mood  Dr. Adrian Blackwater

## 2014-11-27 NOTE — Progress Notes (Signed)
Patient has been actively bleeding for 2 weeks and is going through 10-12 super size tampons each day She rates her cramp pain an 8 out of 10 She reports being tired and is taking her iron pills She says she has been trying to get insurance but is having difficulty and as a result has not been to the GYN She has a lot of personal issues at home and is filling out the depression screening.

## 2014-12-18 ENCOUNTER — Ambulatory Visit: Payer: Self-pay | Admitting: Family Medicine

## 2015-05-26 MED FILL — MEDROXYPROGESTERONE 10 MG T: 10 | 10 days supply | Qty: 10 | Fill #2

## 2015-06-14 MED FILL — MEDROXYPROGESTERONE 10 MG T: 10 | 10 days supply | Qty: 10 | Fill #3

## 2015-07-02 ENCOUNTER — Encounter: Payer: Self-pay | Admitting: Family Medicine

## 2015-07-02 ENCOUNTER — Ambulatory Visit: Payer: Self-pay | Attending: Family Medicine | Admitting: Family Medicine

## 2015-07-02 VITALS — BP 120/77 | HR 79 | Temp 98.4°F | Resp 16 | Ht 64.5 in | Wt 193.0 lb

## 2015-07-02 DIAGNOSIS — G4700 Insomnia, unspecified: Secondary | ICD-10-CM | POA: Insufficient documentation

## 2015-07-02 DIAGNOSIS — Z0000 Encounter for general adult medical examination without abnormal findings: Secondary | ICD-10-CM

## 2015-07-02 DIAGNOSIS — Z79899 Other long term (current) drug therapy: Secondary | ICD-10-CM | POA: Insufficient documentation

## 2015-07-02 DIAGNOSIS — F17210 Nicotine dependence, cigarettes, uncomplicated: Secondary | ICD-10-CM | POA: Insufficient documentation

## 2015-07-02 DIAGNOSIS — F5102 Adjustment insomnia: Secondary | ICD-10-CM

## 2015-07-02 DIAGNOSIS — F411 Generalized anxiety disorder: Secondary | ICD-10-CM | POA: Insufficient documentation

## 2015-07-02 DIAGNOSIS — N920 Excessive and frequent menstruation with regular cycle: Secondary | ICD-10-CM | POA: Insufficient documentation

## 2015-07-02 LAB — CBC
HCT: 41.2 % (ref 36.0–46.0)
Hemoglobin: 13.8 g/dL (ref 12.0–15.0)
MCH: 29.9 pg (ref 26.0–34.0)
MCHC: 33.5 g/dL (ref 30.0–36.0)
MCV: 89.4 fL (ref 78.0–100.0)
MPV: 8.9 fL (ref 8.6–12.4)
PLATELETS: 337 10*3/uL (ref 150–400)
RBC: 4.61 MIL/uL (ref 3.87–5.11)
RDW: 15.3 % (ref 11.5–15.5)
WBC: 7.3 10*3/uL (ref 4.0–10.5)

## 2015-07-02 LAB — POCT GLYCOSYLATED HEMOGLOBIN (HGB A1C): Hemoglobin A1C: 5.7

## 2015-07-02 MED ORDER — MEGESTROL ACETATE 20 MG PO TABS: 20.0000 mg | ORAL_TABLET | Freq: Two times a day (BID) | ORAL | Status: DC

## 2015-07-02 MED ORDER — BUSPIRONE HCL 7.5 MG PO TABS: 7.5000 mg | ORAL_TABLET | Freq: Two times a day (BID) | ORAL | Status: DC

## 2015-07-02 MED ORDER — ZOLPIDEM TARTRATE 10 MG PO TABS: 10.0000 mg | ORAL_TABLET | Freq: Every evening | ORAL | Status: DC | PRN

## 2015-07-02 MED FILL — busPIRone HCL 7.5 MG TABS: 7.5 | 30 days supply | Qty: 60 | Fill #0

## 2015-07-02 MED FILL — MEGESTROL 40 MG TABLET: 40 | 30 days supply | Qty: 15 | Fill #0

## 2015-07-02 NOTE — Patient Instructions (Addendum)
Terri Hogan was seen today for menorrhagia.  Diagnoses and all orders for this visit:  Healthcare maintenance -     POCT glycosylated hemoglobin (Hb A1C)  Menorrhagia with regular cycle -     CBC -     Ambulatory referral to Gynecology -     megestrol (MEGACE) 20 MG tablet; Take 1 tablet (20 mg total) by mouth 2 (two) times daily.  Insomnia due to stress -     zolpidem (AMBIEN) 10 MG tablet; Take 1 tablet (10 mg total) by mouth at bedtime as needed for sleep.  Generalized anxiety disorder -     busPIRone (BUSPAR) 7.5 MG tablet; Take 1 tablet (7.5 mg total) by mouth 2 (two) times daily.   F/u in 6 weeks for menorrhagia  Dr. Adrian Blackwater

## 2015-07-02 NOTE — Assessment & Plan Note (Signed)
A: chronic anxiety P: buspar 7.5 mg BID

## 2015-07-02 NOTE — Assessment & Plan Note (Signed)
A; trazodone did not help P: [employer]

## 2015-07-02 NOTE — Assessment & Plan Note (Signed)
Chronic problem Patient hemodynamically stable   Plan: CBC Megace Referral back to gyn

## 2015-07-02 NOTE — Progress Notes (Signed)
Subjective:  Patient ID: Terri Hogan, female    DOB: [birth year]  Age: 50 y.o. MRN: [id number]  CC: Menorrhagia   HPI BRINDLE BORTON presents for   1. Menorrhagia: this is chronic. She reports heavy menstrual bleeding. She has been bleeding daily since 04/22/2015. No syncope. No palpitations. She has been taking provera 10 mg daily.   2. Anxiety: she has anxiety and insomnia. Trazodone did not help her stay asleep. lexapro made her feel detached. She is stressed. She is unemployed. Her daughter has a chronic GI illness. She has trouble sleeping.   Social History  Substance Use Topics  . Smoking status: Current Every Day Smoker -- 1.00 packs/day    Types: Cigarettes  . Smokeless tobacco: Not on file  . Alcohol Use: No   Outpatient Prescriptions Prior to Visit  Medication Sig Dispense Refill  . escitalopram (LEXAPRO) 5 MG tablet Take 1 tablet (5 mg total) by mouth daily. 30 tablet 2  . ferrous sulfate 325 (65 FE) MG tablet Take 1 tablet (325 mg total) by mouth 2 (two) times daily with a meal. 60 tablet 0  . medroxyPROGESTERone (PROVERA) 10 MG tablet Take 1 tablet (10 mg total) by mouth daily. For 5-10 days as needed to stop menstrual bleeding 10 tablet 3  . traZODone (DESYREL) 50 MG tablet Take 0.5-1 tablets (25-50 mg total) by mouth at bedtime as needed for sleep. 30 tablet 1   No facility-administered medications prior to visit.    ROS Review of Systems  Constitutional: Negative for fever and chills.  Eyes: Negative for visual disturbance.  Respiratory: Negative for shortness of breath.   Cardiovascular: Negative for chest pain.  Gastrointestinal: Negative for abdominal pain and blood in stool.  Genitourinary: Positive for vaginal bleeding.  Musculoskeletal: Negative for back pain and arthralgias.  Skin: Negative for rash.  Allergic/Immunologic: Negative for immunocompromised state.  Hematological: Negative for adenopathy. Does not bruise/bleed easily.    Psychiatric/Behavioral: Positive for sleep disturbance. Negative for suicidal ideas and dysphoric mood. The patient is nervous/anxious.     Objective:  BP 120/77 mmHg  Pulse 79  Temp(Src) 98.4 F (36.9 C) (Oral)  Resp 16  Ht 5' 4.5" (1.638 m)  Wt 193 lb (87.544 kg)  BMI 32.63 kg/m2  SpO2 99%  LMP 04/22/2015  BP/Weight 07/02/2015 123456 [id number]  Systolic BP 123456 [id number]  Diastolic BP 77 81 63  Wt. (Lbs) 193 180 170  BMI 32.63 29.07 29.17   Physical Exam  Constitutional: She is oriented to person, place, and time. She appears well-developed and well-nourished. No distress.  HENT:  Head: Normocephalic and atraumatic.  Cardiovascular: Normal rate, regular rhythm, normal heart sounds and intact distal pulses.   Pulmonary/Chest: Effort normal and breath sounds normal.  Genitourinary:  Patient declined pelvic exam   Musculoskeletal: She exhibits no edema.  Neurological: She is alert and oriented to person, place, and time.  Skin: Skin is warm and dry. No rash noted.  Psychiatric: She has a normal mood and affect.   Lab Results  Component Value Date   HGBA1C 5.70 07/02/2015   Depression screen [facility] 2/9 07/02/2015 11/27/2014 08/31/2014  Decreased Interest 0 3 0  Down, Depressed, Hopeless 0 2 0  PHQ - 2 Score 0 5 0  Altered sleeping 1 3 -  Tired, decreased energy 1 3 -  Change in appetite 1 3 -  Feeling bad or failure about yourself  0 0 -  Trouble concentrating 1 3 -  Moving  slowly or fidgety/restless 0 2 -  Suicidal thoughts 0 0 -  PHQ-9 Score 4 19 -    GAD 7 : Generalized Anxiety Score 07/02/2015  Nervous, Anxious, on Edge 1  Control/stop worrying 1  Worry too much - different things 1  Trouble relaxing 1  Restless 1  Easily annoyed or irritable 1  Afraid - awful might happen 0  Total GAD 7 Score 6    Assessment & Plan:   Katelin was seen today for menorrhagia.  Diagnoses and all orders for this visit:  Healthcare maintenance -     POCT glycosylated  hemoglobin (Hb A1C)  Menorrhagia with regular cycle -     CBC -     Ambulatory referral to Gynecology -     megestrol (MEGACE) 20 MG tablet; Take 1 tablet (20 mg total) by mouth 2 (two) times daily.  Insomnia due to stress -     zolpidem (AMBIEN) 10 MG tablet; Take 1 tablet (10 mg total) by mouth at bedtime as needed for sleep.  Generalized anxiety disorder -     busPIRone (BUSPAR) 7.5 MG tablet; Take 1 tablet (7.5 mg total) by mouth 2 (two) times daily.   Follow-up: No Follow-up on file.   Boykin Nearing MD

## 2015-07-02 NOTE — Progress Notes (Signed)
Pt stated been bleeding since Dec 1,2017 Hx fibroids. Abdominal pain No pain scale #6 Tobacco user 1/2 ppday  No suicidal thought in the past two weeks

## 2015-07-06 ENCOUNTER — Telehealth: Payer: Self-pay

## 2015-07-06 NOTE — Telephone Encounter (Signed)
-----   Message from Boykin Nearing, MD sent at 07/03/2015 12:26 PM EST ----- Cbc normal

## 2015-07-06 NOTE — Telephone Encounter (Signed)
Spoke with patient and she is aware of her normal labs

## 2015-07-07 ENCOUNTER — Encounter: Payer: Self-pay | Admitting: Obstetrics & Gynecology

## 2015-07-30 ENCOUNTER — Telehealth: Payer: Self-pay | Admitting: Family Medicine

## 2015-07-30 ENCOUNTER — Other Ambulatory Visit: Payer: Self-pay | Admitting: Family Medicine

## 2015-07-30 DIAGNOSIS — F5102 Adjustment insomnia: Secondary | ICD-10-CM

## 2015-07-30 DIAGNOSIS — N920 Excessive and frequent menstruation with regular cycle: Secondary | ICD-10-CM

## 2015-07-30 DIAGNOSIS — F411 Generalized anxiety disorder: Secondary | ICD-10-CM

## 2015-07-30 NOTE — Telephone Encounter (Signed)
Patient needs refill for megestrol (MEGACE) 20 MG tablet [id number]   Patient states that busPIRone (BUSPAR) 7.5 MG tablet [id number] is not working, it only makes her dizzy.  Patient would like a prescription for zolpidem (AMBIEN) 10 MG tablet [id number]   Please follow up with patient

## 2015-08-02 ENCOUNTER — Other Ambulatory Visit: Payer: Self-pay | Admitting: Family Medicine

## 2015-08-02 MED ORDER — BUSPIRONE HCL 7.5 MG PO TABS: 7.5000 mg | ORAL_TABLET | Freq: Two times a day (BID) | ORAL | Status: DC

## 2015-08-02 MED ORDER — MEGESTROL ACETATE 20 MG PO TABS: 20.0000 mg | ORAL_TABLET | Freq: Two times a day (BID) | ORAL | Status: DC

## 2015-08-02 MED ORDER — ZOLPIDEM TARTRATE 10 MG PO TABS: 10.0000 mg | ORAL_TABLET | Freq: Every evening | ORAL | Status: DC | PRN

## 2015-08-02 MED FILL — MEGESTROL 40 MG TABLET: 40 | 15 days supply | Qty: 15 | Fill #0

## 2015-08-02 NOTE — Telephone Encounter (Signed)
Error

## 2015-08-02 NOTE — Telephone Encounter (Signed)
Patient called states busPIRone (BUSPAR) 7.5 MG tablet [id number]  Medication is causing patient to have dizziness and would like different medication .Please f/u

## 2015-08-02 NOTE — Telephone Encounter (Signed)
ambien ready for pick up All other meds at pharmacy

## 2015-08-02 NOTE — Telephone Encounter (Signed)
Patient called checking on status of medication refill. Please f/u with patient

## 2015-08-02 NOTE — Telephone Encounter (Signed)
Pt requesting change on Buspar. Stated medication causing Dizziness    Aware Rx Ambien at front office ready to be pickup

## 2015-08-03 MED ORDER — CITALOPRAM HYDROBROMIDE 20 MG PO TABS: 20.0000 mg | ORAL_TABLET | Freq: Every day | ORAL | Status: DC

## 2015-08-03 MED FILL — ?CITALOPRAM HBR 20 MG TABLE: 20 | 30 days supply | Qty: 30 | Fill #0

## 2015-08-03 NOTE — Telephone Encounter (Signed)
Med changed to celexa

## 2015-08-04 NOTE — Telephone Encounter (Signed)
Pt notified  Stated pick up Rx yesterday  Took medication without negative reaction

## 2015-08-11 ENCOUNTER — Ambulatory Visit (INDEPENDENT_AMBULATORY_CARE_PROVIDER_SITE_OTHER): Payer: Self-pay | Admitting: Obstetrics & Gynecology

## 2015-08-11 ENCOUNTER — Other Ambulatory Visit (HOSPITAL_COMMUNITY)
Admission: RE | Admit: 2015-08-11 | Discharge: 2015-08-11 | Disposition: A | Discharge status: Home / Self Care | Payer: Self-pay | Source: Ambulatory Visit | Attending: Obstetrics & Gynecology | Admitting: Obstetrics & Gynecology

## 2015-08-11 ENCOUNTER — Encounter: Payer: Self-pay | Admitting: Obstetrics & Gynecology

## 2015-08-11 VITALS — BP 122/67 | HR 81 | Temp 98.3°F | Resp 20 | Ht 64.5 in | Wt 183.6 lb

## 2015-08-11 DIAGNOSIS — N938 Other specified abnormal uterine and vaginal bleeding: Secondary | ICD-10-CM

## 2015-08-11 NOTE — Progress Notes (Signed)
   Subjective:    Patient ID: Terri Hogan, female    DOB: February 18, 1966, 50 y.o.   MRN: [id number]  HPI  50 yo lady here with the complaint of continued heavy irregular bleeding. Her u/s a year ago showed 2 small fibroids.   Review of Systems She had a BTL 17 years ago.    Objective:   Physical Exam WNWHWFNAD Breathing, conversing, and ambulating normally  UPT negative, consent signed, time out done Cervix prepped with betadine and grasped with a single tooth tenaculum Uterus sounded to 9 cm Pipelle used for 2 passes with a moderate amount of tissue obtained. She tolerated the procedure well.     Assessment & Plan:  DUB- await EMBX results

## 2015-08-11 NOTE — Progress Notes (Signed)
Pt states she has had bleeding continuously since 04/22/15.

## 2015-08-26 ENCOUNTER — Encounter: Payer: Self-pay | Admitting: Obstetrics & Gynecology

## 2015-08-26 ENCOUNTER — Ambulatory Visit (INDEPENDENT_AMBULATORY_CARE_PROVIDER_SITE_OTHER): Payer: Self-pay | Admitting: Obstetrics & Gynecology

## 2015-08-26 VITALS — BP 123/76 | HR 86 | Temp 98.3°F | Ht 65.0 in | Wt 187.6 lb

## 2015-08-26 DIAGNOSIS — N938 Other specified abnormal uterine and vaginal bleeding: Secondary | ICD-10-CM

## 2015-08-26 NOTE — Progress Notes (Signed)
   Subjective:    Patient ID: Terri Hogan, female    DOB: [birth year], 50 y.o.   MRN: [id number]  HPI  50 yo P4 here with the issue of DUB, to discuss her EMBX results.  Review of Systems     Objective:   Physical Exam WNWHWFNAD Breathing, conversing, and ambulating normally Abd- benign EMBX- normal     Assessment & Plan:  DUB- I have offered her an endometrial ablation and quoted her a 90% satisfaction rate, 40% amenorrhea rate. She will fill out financial aid forms And I will send [place] an email to schedule surgery

## 2015-09-08 ENCOUNTER — Encounter (HOSPITAL_COMMUNITY): Payer: Self-pay | Admitting: *Deleted

## 2015-09-10 ENCOUNTER — Encounter (HOSPITAL_COMMUNITY): Payer: Self-pay | Admitting: *Deleted

## 2015-09-30 ENCOUNTER — Other Ambulatory Visit: Payer: Self-pay | Admitting: Family Medicine

## 2015-09-30 DIAGNOSIS — F5102 Adjustment insomnia: Secondary | ICD-10-CM

## 2015-09-30 NOTE — Telephone Encounter (Signed)
Ambien ready for pick up

## 2015-10-01 NOTE — Telephone Encounter (Signed)
LVM Rx at front office ready to be pickup 

## 2015-10-22 ENCOUNTER — Encounter (HOSPITAL_COMMUNITY)
Admission: RE | Disposition: A | Discharge status: Home / Self Care | Payer: Self-pay | Source: Ambulatory Visit | Attending: Obstetrics & Gynecology

## 2015-10-22 ENCOUNTER — Encounter (HOSPITAL_COMMUNITY): Payer: Self-pay

## 2015-10-22 ENCOUNTER — Ambulatory Visit (HOSPITAL_COMMUNITY): Payer: Self-pay | Admitting: Anesthesiology

## 2015-10-22 ENCOUNTER — Ambulatory Visit (HOSPITAL_COMMUNITY)
Admission: RE | Admit: 2015-10-22 | Discharge: 2015-10-22 | Disposition: A | Discharge status: Home / Self Care | Payer: Self-pay | Source: Ambulatory Visit | Attending: Obstetrics & Gynecology | Admitting: Obstetrics & Gynecology

## 2015-10-22 DIAGNOSIS — K219 Gastro-esophageal reflux disease without esophagitis: Secondary | ICD-10-CM | POA: Insufficient documentation

## 2015-10-22 DIAGNOSIS — G4700 Insomnia, unspecified: Secondary | ICD-10-CM | POA: Insufficient documentation

## 2015-10-22 DIAGNOSIS — Z79899 Other long term (current) drug therapy: Secondary | ICD-10-CM | POA: Insufficient documentation

## 2015-10-22 DIAGNOSIS — Z9049 Acquired absence of other specified parts of digestive tract: Secondary | ICD-10-CM | POA: Insufficient documentation

## 2015-10-22 DIAGNOSIS — F419 Anxiety disorder, unspecified: Secondary | ICD-10-CM | POA: Insufficient documentation

## 2015-10-22 DIAGNOSIS — Z9851 Tubal ligation status: Secondary | ICD-10-CM | POA: Insufficient documentation

## 2015-10-22 DIAGNOSIS — N938 Other specified abnormal uterine and vaginal bleeding: Secondary | ICD-10-CM

## 2015-10-22 DIAGNOSIS — F17200 Nicotine dependence, unspecified, uncomplicated: Secondary | ICD-10-CM | POA: Insufficient documentation

## 2015-10-22 HISTORY — DX: Other specified postprocedural states: Z98.890

## 2015-10-22 HISTORY — DX: Nausea with vomiting, unspecified: R11.2

## 2015-10-22 HISTORY — DX: Insomnia, unspecified: G47.00

## 2015-10-22 HISTORY — PX: DILITATION & CURRETTAGE/HYSTROSCOPY WITH NOVASURE ABLATION: SHX5568

## 2015-10-22 HISTORY — DX: Gastro-esophageal reflux disease without esophagitis: K21.9

## 2015-10-22 HISTORY — DX: Anemia, unspecified: D64.9

## 2015-10-22 LAB — CBC
HCT: 47 % — ABNORMAL HIGH (ref 36.0–46.0)
Hemoglobin: 16.2 g/dL — ABNORMAL HIGH (ref 12.0–15.0)
MCH: 30.8 pg (ref 26.0–34.0)
MCHC: 34.5 g/dL (ref 30.0–36.0)
MCV: 89.4 fL (ref 78.0–100.0)
PLATELETS: 438 10*3/uL — AB (ref 150–400)
RBC: 5.26 MIL/uL — AB (ref 3.87–5.11)
RDW: 14.4 % (ref 11.5–15.5)
WBC: 9.7 10*3/uL (ref 4.0–10.5)

## 2015-10-22 LAB — PREGNANCY, URINE: PREG TEST UR: NEGATIVE

## 2015-10-22 SURGERY — DILATATION & CURETTAGE/HYSTEROSCOPY WITH NOVASURE ABLATION
Anesthesia: General | Site: Vagina

## 2015-10-22 MED ORDER — DEXAMETHASONE SODIUM PHOSPHATE 10 MG/ML IJ SOLN
INTRAMUSCULAR | Status: DC | PRN
  Administered 2015-10-22: 4 mg via INTRAVENOUS

## 2015-10-22 MED ORDER — OXYCODONE-ACETAMINOPHEN 5-325 MG PO TABS: 1.0000 | ORAL_TABLET | Freq: Four times a day (QID) | ORAL | Status: DC | PRN

## 2015-10-22 MED ORDER — LIDOCAINE HCL (CARDIAC) 20 MG/ML IV SOLN
INTRAVENOUS | Status: DC | PRN
  Administered 2015-10-22: 100 mg via INTRAVENOUS

## 2015-10-22 MED ORDER — PROPOFOL 10 MG/ML IV BOLUS
INTRAVENOUS | Status: AC
  Filled 2015-10-22: qty 20

## 2015-10-22 MED ORDER — FENTANYL CITRATE (PF) 100 MCG/2ML IJ SOLN
INTRAMUSCULAR | Status: AC
  Administered 2015-10-22: 50 ug via INTRAVENOUS
  Filled 2015-10-22: qty 2

## 2015-10-22 MED ORDER — LACTATED RINGERS IV SOLN
INTRAVENOUS | Status: DC
  Administered 2015-10-22: 12:00:00 via INTRAVENOUS

## 2015-10-22 MED ORDER — IBUPROFEN 600 MG PO TABS: 600.0000 mg | ORAL_TABLET | Freq: Four times a day (QID) | ORAL | Status: DC | PRN

## 2015-10-22 MED ORDER — MIDAZOLAM HCL 2 MG/2ML IJ SOLN
2.0000 mg | Freq: Once | INTRAMUSCULAR | Status: AC
  Administered 2015-10-22: 2 mg via INTRAVENOUS

## 2015-10-22 MED ORDER — SCOPOLAMINE 1 MG/3DAYS TD PT72
MEDICATED_PATCH | TRANSDERMAL | Status: AC
  Administered 2015-10-22: 1.5 mg via TRANSDERMAL
  Filled 2015-10-22: qty 1

## 2015-10-22 MED ORDER — KETOROLAC TROMETHAMINE 30 MG/ML IJ SOLN
INTRAMUSCULAR | Status: DC | PRN
  Administered 2015-10-22: 30 mg via INTRAVENOUS

## 2015-10-22 MED ORDER — BUPIVACAINE HCL (PF) 0.5 % IJ SOLN
INTRAMUSCULAR | Status: AC
  Filled 2015-10-22: qty 30

## 2015-10-22 MED ORDER — FENTANYL CITRATE (PF) 100 MCG/2ML IJ SOLN
INTRAMUSCULAR | Status: AC
  Filled 2015-10-22: qty 2

## 2015-10-22 MED ORDER — LIDOCAINE HCL (CARDIAC) 20 MG/ML IV SOLN
INTRAVENOUS | Status: AC
  Filled 2015-10-22: qty 5

## 2015-10-22 MED ORDER — MIDAZOLAM HCL 2 MG/2ML IJ SOLN
INTRAMUSCULAR | Status: AC
  Filled 2015-10-22: qty 2

## 2015-10-22 MED ORDER — MEPERIDINE HCL 25 MG/ML IJ SOLN: 6.2500 mg | INTRAMUSCULAR | Status: DC | PRN

## 2015-10-22 MED ORDER — FENTANYL CITRATE (PF) 100 MCG/2ML IJ SOLN
25.0000 ug | INTRAMUSCULAR | Status: DC | PRN
  Administered 2015-10-22 (×3): 50 ug via INTRAVENOUS

## 2015-10-22 MED ORDER — DEXAMETHASONE SODIUM PHOSPHATE 4 MG/ML IJ SOLN
INTRAMUSCULAR | Status: AC
  Filled 2015-10-22: qty 1

## 2015-10-22 MED ORDER — KETOROLAC TROMETHAMINE 30 MG/ML IJ SOLN
INTRAMUSCULAR | Status: AC
  Filled 2015-10-22: qty 1

## 2015-10-22 MED ORDER — FENTANYL CITRATE (PF) 100 MCG/2ML IJ SOLN
INTRAMUSCULAR | Status: DC | PRN
  Administered 2015-10-22: 100 ug via INTRAVENOUS

## 2015-10-22 MED ORDER — LACTATED RINGERS IV SOLN
INTRAVENOUS | Status: DC
  Administered 2015-10-22 (×2): via INTRAVENOUS

## 2015-10-22 MED ORDER — BUPIVACAINE HCL 0.5 % IJ SOLN
INTRAMUSCULAR | Status: DC | PRN
  Administered 2015-10-22: 30 mL

## 2015-10-22 MED ORDER — ONDANSETRON HCL 4 MG/2ML IJ SOLN
INTRAMUSCULAR | Status: DC | PRN
  Administered 2015-10-22: 4 mg via INTRAVENOUS

## 2015-10-22 MED ORDER — PROPOFOL 10 MG/ML IV BOLUS
INTRAVENOUS | Status: DC | PRN
  Administered 2015-10-22: 200 mg via INTRAVENOUS

## 2015-10-22 MED ORDER — MIDAZOLAM HCL 2 MG/2ML IJ SOLN
INTRAMUSCULAR | Status: DC | PRN
  Administered 2015-10-22: 2 mg via INTRAVENOUS

## 2015-10-22 MED ORDER — METOCLOPRAMIDE HCL 5 MG/ML IJ SOLN: 10.0000 mg | Freq: Once | INTRAMUSCULAR | Status: DC | PRN

## 2015-10-22 MED ORDER — ONDANSETRON HCL 4 MG/2ML IJ SOLN
INTRAMUSCULAR | Status: AC
  Filled 2015-10-22: qty 2

## 2015-10-22 MED ORDER — SCOPOLAMINE 1 MG/3DAYS TD PT72
1.0000 | MEDICATED_PATCH | Freq: Once | TRANSDERMAL | Status: DC
  Administered 2015-10-22: 1.5 mg via TRANSDERMAL

## 2015-10-22 SURGICAL SUPPLY — 16 items
ABLATOR ENDOMETRIAL BIPOLAR (ABLATOR) ×5 IMPLANT
CLOTH BEACON ORANGE TIMEOUT ST (SAFETY) ×3 IMPLANT
GLOVE BIO SURGEON STRL SZ 6.5 (GLOVE) ×2 IMPLANT
GLOVE BIO SURGEONS STRL SZ 6.5 (GLOVE) ×1
GLOVE BIOGEL PI IND STRL 7.0 (GLOVE) ×1 IMPLANT
GLOVE BIOGEL PI INDICATOR 7.0 (GLOVE) ×2
GOWN STRL REUS W/TWL LRG LVL3 (GOWN DISPOSABLE) ×9 IMPLANT
NDL SPNL 18GX3.5 QUINCKE PK (NEEDLE) ×1 IMPLANT
NEEDLE SPNL 18GX3.5 QUINCKE PK (NEEDLE) ×3 IMPLANT
PACK VAGINAL MINOR WOMEN LF (CUSTOM PROCEDURE TRAY) ×3 IMPLANT
PAD OB MATERNITY 4.3X12.25 (PERSONAL CARE ITEMS) ×3 IMPLANT
SYR 30ML LL (SYRINGE) ×3 IMPLANT
TOWEL OR 17X24 6PK STRL BLUE (TOWEL DISPOSABLE) ×6 IMPLANT
TUBING AQUILEX INFLOW (TUBING) ×1 IMPLANT
TUBING AQUILEX OUTFLOW (TUBING) ×1 IMPLANT
WATER STERILE IRR 1000ML POUR (IV SOLUTION) ×3 IMPLANT

## 2015-10-22 NOTE — Op Note (Signed)
10/22/2015  11:01 AM  PATIENT:  Terri Hogan  50 y.o. female  PRE-OPERATIVE DIAGNOSIS:  DUB  POST-OPERATIVE DIAGNOSIS:  same  PROCEDURE:  Procedure(s): DILATATION & CURETTAGE WITH NOVASURE ABLATION (N/A)  SURGEON:  Surgeon(s) and Role:    * Emily Filbert, MD - Primary  PHYSICIAN ASSISTANT:   ASSISTANTS: none   ANESTHESIA:   general  EBL:  Total I/O In: 1000 [I.V.:1000] Out: 30 [Urine:30]  BLOOD ADMINISTERED:none  DRAINS: none   LOCAL MEDICATIONS USED:  MARCAINE     SPECIMEN:  Source of Specimen:  uterine curettings  DISPOSITION OF SPECIMEN:  PATHOLOGY  COUNTS:  YES  TOURNIQUET:  * No tourniquets in log *  DICTATION: .Dragon Dictation  PLAN OF CARE: Discharge to home after PACU  PATIENT DISPOSITION:  PACU - hemodynamically stable.   Delay start of Pharmacological VTE agent (>24hrs) due to surgical blood loss or risk of bleeding: not applicable    The risks, benefits, and alternatives of surgery were explained, understood, and accepted. All questions were answered. Consents were signed. In the operating room general anesthesia was applied without complication, and she was placed in the dorsal lithotomy position. Her vagina was prepped and draped in the usual sterile fashion. A Robinson catheter was used to drain her bladder. A bimanual exam revealed a normal size and shape anteverted mobile uterus. Her adnexa were nonenlarged. A speculum was placed and a single-tooth tenaculum was used to grasp the anterior lip of her cervix. A total of 30 mL of 0.5% Marcaine was used to perform a paracervical block. Her uterus sounded to 10 cm. Her cervix was carefully and slowly dilated to accommodate a small curette. A curettage was done in all quadrants and the fundus of the uterus. A moderate amount of polypoid-type  tissue was obtained. A gritty sensation was appreciated throughout.  I then proceeded with the Novasure endometrial ablation. Her cavity length was 5 cm and the width 3  cm. The device passed its test and ran for 2 minutes. There was no bleeding noted at the end of the case. She was taken to the recovery room after being extubated. She tolerated the procedure well.

## 2015-10-22 NOTE — Anesthesia Procedure Notes (Signed)
Procedure Name: LMA Insertion Date/Time: 10/22/2015 10:45 AM Performed by: Hewitt Blade Pre-anesthesia Checklist: Patient identified, Emergency Drugs available, Suction available and Patient being monitored Patient Re-evaluated:Patient Re-evaluated prior to inductionOxygen Delivery Method: Circle system utilized Preoxygenation: Pre-oxygenation with 100% oxygen Intubation Type: IV induction LMA: LMA inserted LMA Size: 4.0 Number of attempts: 1 Placement Confirmation: positive ETCO2 and breath sounds checked- equal and bilateral Tube secured with: Tape Dental Injury: Teeth and Oropharynx as per pre-operative assessment

## 2015-10-22 NOTE — H&P (Signed)
Terri Hogan is an 50 yo lady here with the complaint of continued heavy irregular bleeding. Her u/s a year ago showed 2 small fibroids.    She had a BTL 17 years ago.      Patient's last menstrual period was 04/22/2015.    Past Medical History  Diagnosis Date  . Menorrhagia   . Anemia   . SVD (spontaneous vaginal delivery)     x 2  . PONV (postoperative nausea and vomiting)   . GERD (gastroesophageal reflux disease)     otc prn  . Insomnia     Past Surgical History  Procedure Laterality Date  . Carpal tunnel release Right   . Cholecystectomy    . Tubal ligation    . Cesarean section      x 1    Family History  Problem Relation Age of Onset  . Diabetes Mother   . Cancer Father     cirrhosis    Social History:  reports that she has been smoking Cigarettes.  She has a 10 pack-year smoking history. She has never used smokeless tobacco. She reports that she drinks alcohol. She reports that she does not use illicit drugs.  Allergies:  Allergies  Allergen Reactions  . Codeine Hives, Itching and Nausea And Vomiting    Prescriptions prior to admission  Medication Sig Dispense Refill Last Dose  . ranitidine (ZANTAC) 150 MG tablet Take 150 mg by mouth once as needed for heartburn.   Past Week at Unknown time  . zolpidem (AMBIEN) 10 MG tablet TAKE 1 TABLET BY MOUTH AT BEDTIME AS NEEDED FOR SLEEP 30 tablet 3 10/21/2015 at Unknown time  . citalopram (CELEXA) 20 MG tablet Take 1 tablet (20 mg total) by mouth daily. (Patient not taking: Reported on 08/11/2015) 30 tablet 3 Not Taking  . ferrous sulfate 325 (65 FE) MG tablet Take 1 tablet (325 mg total) by mouth 2 (two) times daily with a meal. (Patient not taking: Reported on 08/11/2015) 60 tablet 0 Not Taking  . megestrol (MEGACE) 20 MG tablet Take 1 tablet (20 mg total) by mouth 2 (two) times daily. (Patient not taking: Reported on 10/09/2015) 30 tablet 1 Taking    ROS  Blood pressure 126/93, pulse 82, temperature 97.9 F  (36.6 C), temperature source Oral, resp. rate 16, height 5' 4.5" (1.638 m), weight 83.915 kg (185 lb), last menstrual period 04/22/2015, SpO2 100 %. Physical Exam  Heart- rrr Lungs- CTAB Abd- benign  Results for orders placed or performed during the hospital encounter of 10/22/15 (from the past 24 hour(s))  Pregnancy, urine     Status: None   Collection Time: 10/22/15  8:30 AM  Result Value Ref Range   Preg Test, Ur NEGATIVE NEGATIVE  CBC     Status: Abnormal   Collection Time: 10/22/15  8:40 AM  Result Value Ref Range   WBC 9.7 4.0 - 10.5 K/uL   RBC 5.26 (H) 3.87 - 5.11 MIL/uL   Hemoglobin 16.2 (H) 12.0 - 15.0 g/dL   HCT 47.0 (H) 36.0 - 46.0 %   MCV 89.4 78.0 - 100.0 fL   MCH 30.8 26.0 - 34.0 pg   MCHC 34.5 30.0 - 36.0 g/dL   RDW 14.4 11.5 - 15.5 %   Platelets 438 (H) 150 - 400 K/uL    No results found.  Assessment/Plan: DUB- plan for d&c, endometrial ablation  She understands the risks of surgery, including, but not to infection, bleeding, DVTs, damage to bowel, bladder,  ureters. She wishes to proceed.     Brasen Bundren C. 10/22/2015, 10:24 AM

## 2015-10-22 NOTE — Anesthesia Postprocedure Evaluation (Signed)
Anesthesia Post Note  Patient: Terri Hogan  Procedure(s) Performed: Procedure(s) (LRB): DILATATION & CURETTAGE WITH NOVASURE ABLATION (N/A)  Patient location during evaluation: PACU Anesthesia Type: General Level of consciousness: awake and alert Pain management: pain level controlled Vital Signs Assessment: post-procedure vital signs reviewed and stable Respiratory status: spontaneous breathing, nonlabored ventilation, respiratory function stable and patient connected to nasal cannula oxygen Cardiovascular status: blood pressure returned to baseline and stable Postop Assessment: no signs of nausea or vomiting Anesthetic complications: no     Last Vitals:  Filed Vitals:   10/22/15 1224 10/22/15 1300  BP:  120/78  Pulse:  75  Temp:  36.7 C  Resp: 18 18    Last Pain:  Filed Vitals:   10/22/15 1306  PainSc: 2    Pain Goal: Patients Stated Pain Goal: 3 (10/22/15 1215)               Montez Hageman

## 2015-10-22 NOTE — Discharge Instructions (Signed)
Dilation and Curettage or Vacuum Curettage, Care After Refer to this sheet in the next few weeks. These instructions provide you with information on caring for yourself after your procedure. Your health care provider may also give you more specific instructions. Your treatment has been planned according to current medical practices, but problems sometimes occur. Call your health care provider if you have any problems or questions after your procedure. WHAT TO EXPECT AFTER THE PROCEDURE After your procedure, it is typical to have light cramping and bleeding. This may last for 2 days to 2 weeks after the procedure. HOME CARE INSTRUCTIONS   Do not drive for 24 hours.  Wait 1 week before returning to strenuous activities.  Take your temperature 2 times a day for 4 days and write it down. Provide these temperatures to your health care provider if you develop a fever.  Avoid long periods of standing.  Avoid heavy lifting, pushing, or pulling. Do not lift anything heavier than 10 pounds (4.5 kg).  Limit stair climbing to once or twice a day.  Take rest periods often.  You may resume your usual diet.  Drink enough fluids to keep your urine clear or pale yellow.  Your usual bowel function should return. If you have constipation, you may:  Take a mild laxative with permission from your health care provider.  Add fruit and bran to your diet.  Drink more fluids.  Take showers instead of baths until your health care provider gives you permission to take baths.  Do not go swimming or use a hot tub until your health care provider approves.  Try to have someone with you or available to you the first 24-48 hours, especially if you were given a general anesthetic.  Do not douche, use tampons, or have sex (intercourse) for 2 weeks after the procedure.  Only take over-the-counter or prescription medicines as directed by your health care provider. Do not take aspirin. It can cause  bleeding.  Follow up with your health care provider as directed. SEEK MEDICAL CARE IF:   You have increasing cramps or pain that is not relieved with medicine.  You have abdominal pain that does not seem to be related to the same area of earlier cramping and pain.  You have bad smelling vaginal discharge.  You have a rash.  You are having problems with any medicine. SEEK IMMEDIATE MEDICAL CARE IF:   You have bleeding that is heavier than a normal menstrual period.  You have a fever.  You have chest pain.  You have shortness of breath.  You feel dizzy or feel like fainting.  You pass out.  You have pain in your shoulder strap area.  You have heavy vaginal bleeding with or without blood clots. MAKE SURE YOU:   Understand these instructions.  Will watch your condition.  Will get help right away if you are not doing well or get worse.   This information is not intended to replace advice given to you by your health care provider. Make sure you discuss any questions you have with your health care provider.   Document Released: 05/05/2000 Document Revised: 05/13/2013 Document Reviewed: 12/05/2012 Elsevier Interactive Patient Education 2016 [place] INSTRUCTIONS: HYSTEROSCOPY / ENDOMETRIAL ABLATION The following instructions have been prepared to help you care for yourself upon your return home.  May Remove Scop patch on or before  May take Ibuprofen after  May take stool softner while taking narcotic pain medication to prevent constipation.  Drink plenty  of water.  Personal hygiene:  Use sanitary pads for vaginal drainage, not tampons.  Shower the day after your procedure.  NO tub baths, pools or Jacuzzis for 2-3 weeks.  Wipe front to back after using the bathroom.  Activity and limitations:  Do NOT drive or operate any equipment for 24 hours. The effects of anesthesia are still present and drowsiness may result.  Do NOT rest in bed all  day.  Walking is encouraged.  Walk up and down stairs slowly.  You may resume your normal activity in one to two days or as indicated by your physician. Sexual activity: NO intercourse for at least 2 weeks after the procedure, or as indicated by your Doctor.  Diet: Eat a light meal as desired this evening. You may resume your usual diet tomorrow.  Return to Work: You may resume your work activities in one to two days or as indicated by Marine scientist.  What to expect after your surgery: Expect to have vaginal bleeding/discharge for 2-3 days and spotting for up to 10 days. It is not unusual to have soreness for up to 1-2 weeks. You may have a slight burning sensation when you urinate for the first day. Mild cramps may continue for a couple of days. You may have a regular period in 2-6 weeks.  Call your doctor for any of the following:  Excessive vaginal bleeding or clotting, saturating and changing one pad every hour.  Inability to urinate 6 hours after discharge from hospital.  Pain not relieved by pain medication.  Fever of 100.4 F or greater.  Unusual vaginal discharge or odor.  Return to office _________________Call for an appointment ___________________ Patients signature: ______________________ Nurses signature ________________________  Post Anesthesia Care Unit [phone] INSTRUCTIONS: HYSTEROSCOPY / ENDOMETRIAL ABLATION The following instructions have been prepared to help you care for yourself upon your return home.  May Remove Scop patch on or before  May take Ibuprofen after  May take stool softner while taking narcotic pain medication to prevent constipation.  Drink plenty of water.  Personal hygiene:  Use sanitary pads for vaginal drainage, not tampons.  Shower the day after your procedure.  NO tub baths, pools or Jacuzzis for 2-3 weeks.  Wipe front to back after using the bathroom.  Activity and limitations:  Do NOT drive or operate any  equipment for 24 hours. The effects of anesthesia are still present and drowsiness may result.  Do NOT rest in bed all day.  Walking is encouraged.  Walk up and down stairs slowly.  You may resume your normal activity in one to two days or as indicated by your physician. Sexual activity: NO intercourse for at least 2 weeks after the procedure, or as indicated by your Doctor.  Diet: Eat a light meal as desired this evening. You may resume your usual diet tomorrow.  Return to Work: You may resume your work activities in one to two days or as indicated by Marine scientist.  What to expect after your surgery: Expect to have vaginal bleeding/discharge for 2-3 days and spotting for up to 10 days. It is not unusual to have soreness for up to 1-2 weeks. You may have a slight burning sensation when you urinate for the first day. Mild cramps may continue for a couple of days. You may have a regular period in 2-6 weeks.  Call your doctor for any of the following:  Excessive vaginal bleeding or clotting, saturating and changing one pad every hour.  Inability to  urinate 6 hours after discharge from hospital.  Pain not relieved by pain medication.  Fever of 100.4 F or greater.  Unusual vaginal discharge or odor.  Return to office _________________Call for an appointment ___________________ Patients signature: ______________________ Nurses signature ________________________  Can take ibuprofen or motrin at 5Pm  Today   June 2nd Drink extra water next 48hours. Can use a heating pad to abdomen  [facility] [phone]

## 2015-10-22 NOTE — Transfer of Care (Signed)
Immediate Anesthesia Transfer of Care Note  Patient: Terri Hogan  Procedure(s) Performed: Procedure(s): DILATATION & CURETTAGE WITH NOVASURE ABLATION (N/A)  Patient Location: PACU  Anesthesia Type:General  Level of Consciousness: awake, alert  and oriented  Airway & Oxygen Therapy: Patient Spontanous Breathing and Patient connected to nasal cannula oxygen  Post-op Assessment: Report given to RN, Post -op Vital signs reviewed and stable and Patient moving all extremities  Post vital signs: Reviewed and stable  Last Vitals:  Filed Vitals:   10/22/15 0849  BP: 126/93  Pulse: 82  Temp: 36.6 C  Resp: 16    Last Pain: There were no vitals filed for this visit.    Patients Stated Pain Goal: 3 ([id number])  Complications: No apparent anesthesia complications

## 2015-10-22 NOTE — Op Note (Deleted)
10/22/2015  10:03 AM  PATIENT:  Terri Hogan  50 y.o. female  PRE-OPERATIVE DIAGNOSIS:  DUB, DESIRE FOR STERILITY, MORBID OBESITY  POST-OPERATIVE DIAGNOSIS: SAME + DENSE PELVIC ADHESIONS  PROCEDURE:   LAPAROSCOPY, LYSIS OF ADHESIONS, BILATERAL SALPINGECTOMY DILATION AND CURETTAGE, ATTEMPTED NOVASURE ENDOMETRIAL ABLATION REMOVAL OF NEXPLANON  SURGEON:  Surgeon(s) and Role:    * Emily Filbert, MD - Primary  PHYSICIAN ASSISTANT:   ASSISTANTS: none   ANESTHESIA:   general  EBL:   minimal  BLOOD ADMINISTERED:none  DRAINS: none   LOCAL MEDICATIONS USED:  MARCAINE     SPECIMEN:  Source of Specimen:  both oviducts, endometrial curettings  DISPOSITION OF SPECIMEN:  PATHOLOGY  COUNTS:  YES  TOURNIQUET:  * No tourniquets in log *  DICTATION: .Dragon Dictation  PLAN OF CARE: Discharge to home after PACU  PATIENT DISPOSITION:  PACU - hemodynamically stable.   Delay start of Pharmacological VTE agent (>24hrs) due to surgical blood loss or risk of bleeding: not applicable  The risks, benefits, and alternatives of surgery were explained, understood, accepted. She is certain that she wants permanent sterility. She understands that this is not reversible. She understands there is a small failure rate of this procedure. She also would like to have both oviducts removed her due newest recommendations to help prevent ovarian/peritoneal cancer. In the operating room she was placed in the dorsal lithotomy position, and general anesthesia was given without complication. Her abdomen and vagina were prepped and draped in the usual sterile fashion. A timeout procedure was done. A bimanual exam revealed a 12 week size non-mobile  uterus. Her adnexa felt normal. A Hulka manipulator was placed. Her bladder was emptied with a Robinson catheter. Gloves were changed, and attention was turned to the abdomen. Approximately the 5 mL of 0.5% Marcaine was injected into all incisions sites. An OG tube was  placed. An incision was made in the left upper quadrant as I was very suspicious that she might have adhesions from her previous myomectomy and cesarean section. A Veress  needle was placed intraperitoneally. I had to use the extra long one as the normal one would not reach her peritoneal cavity. Low-flow CO2 was used to insufflate the abdomen to approximately 3-1/2 L. Patient abdominal pressure was always less than 15. Once a good pneumoperitoneum was established, a 5 mm Excel trocar was placed. Laparoscopy confirmed correct placement. A 5 mm port was placed in the left lower quadrant under direct laparoscopic visualization. Her uterus was essentially completely stuck up to the anterior abdominal wall with the omentum in between. The left adnexa was densely adherent to the anterior abdominal wall and the right adnexa was not visible. Using the Harmonic scapel, I did extensive lysis of adhesions, eventually allowing the uterus to separate from the abdominal wall. Hemostasis was maintained throughout. I was then able to place a 10 mm port in the right lower quadrant under direct visualization.  A Harmonic scapel was used to release the left adnexa from the anterior abdominal wall. I then removed the left distal end of the tube. The remainder of the tube was densely adherent to the omentum/uterine complex.  I was then able to see the right adnexa and remove the entire right oviduct. I removed the oviducts through the 10 mm port.  The  fascia of the 10 mm port was closed with a 0 vicryl suture using the [employer] fascial closure device. No defects were palpable. The CO2 was allowed to escape from  the abdomin. The 5 mm ports were removed. I closed the deep subQ tissue at the 10 mm port site with a 2-0 vicryl suture and then did a subcuticular closure was done with 4-0 Vicryl suture there. The 5 mm port sites were closed with Dermabond. A Steri-Strip was placed across each incision. I then proceeded with the d&c.    Marland Kitchen A speculum was placed and a single-tooth tenaculum was used to grasp the anterior lip of her cervix. A total of 20 mL of 0.5% Marcaine was used to perform a paracervical block. Her uterus sounded to 14 cm. Her cervix was carefully and slowly dilated to accommodate a small curette. A curettage was done in all quadrants and the fundus of the uterus. A large amount of polypoid-type  tissue was obtained. A gritty sensation was appreciated throughout.  I then placed the Novasure device, with the cavity length being measured at 9 cm. The Novasure device did not pass its test. I didn't think that the HTA would work on a uterus of her size. I therefore stopped the procedure and removed her intact Nexplanon after sterilely prepping her arm. A steristrip was used to close the incision. There was no bleeding noted at the end of the case. She was taken to the recovery room after being extubated. She tolerated the procedure well.    She was extubated and taken to the recovery room in stable condition.

## 2015-10-22 NOTE — Anesthesia Preprocedure Evaluation (Addendum)
Anesthesia Evaluation  Patient identified by MRN, date of birth, ID band Patient awake    Reviewed: Allergy & Precautions, NPO status , Patient's Chart, lab work & pertinent test results  History of Anesthesia Complications (+) PONV  Airway Mallampati: II  TM Distance: >3 FB Neck ROM: Full    Dental no notable dental hx.    Pulmonary Current Smoker,    Pulmonary exam normal breath sounds clear to auscultation       Cardiovascular negative cardio ROS Normal cardiovascular exam Rhythm:Regular Rate:Normal     Neuro/Psych Anxiety negative neurological ROS     GI/Hepatic Neg liver ROS, GERD  ,  Endo/Other  negative endocrine ROS  Renal/GU negative Renal ROS     Musculoskeletal negative musculoskeletal ROS (+)   Abdominal   Peds  Hematology  (+) anemia ,   Anesthesia Other Findings   Reproductive/Obstetrics                           Anesthesia Physical Anesthesia Plan  ASA: II  Anesthesia Plan: General   Post-op Pain Management:    Induction: Intravenous  Airway Management Planned: LMA  Additional Equipment:   Intra-op Plan:   Post-operative Plan: Extubation in OR  Informed Consent: I have reviewed the patients History and Physical, chart, labs and discussed the procedure including the risks, benefits and alternatives for the proposed anesthesia with the patient or authorized representative who has indicated his/her understanding and acceptance.   Dental advisory given  Plan Discussed with: CRNA  Anesthesia Plan Comments:         Anesthesia Quick Evaluation

## 2015-10-25 ENCOUNTER — Encounter (HOSPITAL_COMMUNITY): Payer: Self-pay | Admitting: Obstetrics & Gynecology

## 2015-12-14 ENCOUNTER — Emergency Department (HOSPITAL_COMMUNITY): Payer: Self-pay

## 2015-12-14 ENCOUNTER — Emergency Department (HOSPITAL_COMMUNITY)
Admission: EM | Admit: 2015-12-14 | Discharge: 2015-12-14 | Disposition: A | Discharge status: Home / Self Care | Payer: Self-pay | Attending: Emergency Medicine | Admitting: Emergency Medicine

## 2015-12-14 ENCOUNTER — Encounter (HOSPITAL_COMMUNITY): Payer: Self-pay

## 2015-12-14 DIAGNOSIS — R1030 Lower abdominal pain, unspecified: Secondary | ICD-10-CM | POA: Insufficient documentation

## 2015-12-14 DIAGNOSIS — F17210 Nicotine dependence, cigarettes, uncomplicated: Secondary | ICD-10-CM | POA: Insufficient documentation

## 2015-12-14 DIAGNOSIS — R197 Diarrhea, unspecified: Secondary | ICD-10-CM | POA: Insufficient documentation

## 2015-12-14 DIAGNOSIS — R112 Nausea with vomiting, unspecified: Secondary | ICD-10-CM | POA: Insufficient documentation

## 2015-12-14 LAB — URINALYSIS, ROUTINE W REFLEX MICROSCOPIC
Bilirubin Urine: NEGATIVE
Glucose, UA: NEGATIVE mg/dL
Ketones, ur: NEGATIVE mg/dL
NITRITE: NEGATIVE
PROTEIN: NEGATIVE mg/dL
Specific Gravity, Urine: 1.007 (ref 1.005–1.030)
pH: 7 (ref 5.0–8.0)

## 2015-12-14 LAB — URINE MICROSCOPIC-ADD ON

## 2015-12-14 LAB — CBC
HCT: 48.8 % — ABNORMAL HIGH (ref 36.0–46.0)
HEMOGLOBIN: 16.6 g/dL — AB (ref 12.0–15.0)
MCH: 30.7 pg (ref 26.0–34.0)
MCHC: 34 g/dL (ref 30.0–36.0)
MCV: 90.4 fL (ref 78.0–100.0)
PLATELETS: 330 10*3/uL (ref 150–400)
RBC: 5.4 MIL/uL — AB (ref 3.87–5.11)
RDW: 13.7 % (ref 11.5–15.5)
WBC: 8.5 10*3/uL (ref 4.0–10.5)

## 2015-12-14 LAB — COMPREHENSIVE METABOLIC PANEL
ALK PHOS: 72 U/L (ref 38–126)
ALT: 17 U/L (ref 14–54)
ANION GAP: 10 (ref 5–15)
AST: 19 U/L (ref 15–41)
Albumin: 4.5 g/dL (ref 3.5–5.0)
BUN: 6 mg/dL (ref 6–20)
CALCIUM: 9.3 mg/dL (ref 8.9–10.3)
CO2: 23 mmol/L (ref 22–32)
CREATININE: 0.72 mg/dL (ref 0.44–1.00)
Chloride: 105 mmol/L (ref 101–111)
Glucose, Bld: 140 mg/dL — ABNORMAL HIGH (ref 65–99)
Potassium: 3.3 mmol/L — ABNORMAL LOW (ref 3.5–5.1)
Sodium: 138 mmol/L (ref 135–145)
TOTAL PROTEIN: 7.4 g/dL (ref 6.5–8.1)
Total Bilirubin: 0.6 mg/dL (ref 0.3–1.2)

## 2015-12-14 LAB — LIPASE, BLOOD: Lipase: 35 U/L (ref 11–51)

## 2015-12-14 LAB — C DIFFICILE QUICK SCREEN W PCR REFLEX
C Diff antigen: NEGATIVE
C Diff interpretation: NOT DETECTED
C Diff toxin: NEGATIVE

## 2015-12-14 MED ORDER — POTASSIUM CHLORIDE 20 MEQ PO PACK: 40.0000 meq | PACK | Freq: Once | ORAL | Status: DC

## 2015-12-14 MED ORDER — IOPAMIDOL (ISOVUE-300) INJECTION 61%
INTRAVENOUS | Status: AC
  Administered 2015-12-14: 100 mL
  Filled 2015-12-14: qty 100

## 2015-12-14 MED ORDER — SODIUM CHLORIDE 0.9 % IV BOLUS (SEPSIS)
1000.0000 mL | Freq: Once | INTRAVENOUS | Status: AC
  Administered 2015-12-14: 1000 mL via INTRAVENOUS

## 2015-12-14 MED ORDER — ONDANSETRON HCL 4 MG/2ML IJ SOLN
4.0000 mg | Freq: Once | INTRAMUSCULAR | Status: AC
  Administered 2015-12-14: 4 mg via INTRAVENOUS
  Filled 2015-12-14: qty 2

## 2015-12-14 MED ORDER — ONDANSETRON HCL 4 MG/2ML IJ SOLN
4.0000 mg | Freq: Once | INTRAMUSCULAR | Status: AC | PRN
  Administered 2015-12-14: 4 mg via INTRAVENOUS
  Filled 2015-12-14: qty 2

## 2015-12-14 MED ORDER — ONDANSETRON 4 MG PO TBDP: 4.0000 mg | ORAL_TABLET | Freq: Three times a day (TID) | ORAL | 0 refills | Status: DC | PRN

## 2015-12-14 MED ORDER — POTASSIUM CHLORIDE CRYS ER 20 MEQ PO TBCR
40.0000 meq | EXTENDED_RELEASE_TABLET | Freq: Once | ORAL | Status: AC
  Administered 2015-12-14: 40 meq via ORAL
  Filled 2015-12-14: qty 2

## 2015-12-14 NOTE — ED Notes (Signed)
Patient aware of need of urine but is unable to urinate at this time; will check back with patient

## 2015-12-14 NOTE — ED Notes (Signed)
D/C instructions reviewed with pt. And famly member. She verbalizes understanding of medications and follow up appointments . Pt. Leaving with son and denies questions and pain

## 2015-12-14 NOTE — ED Notes (Signed)
Patient transported to CT 

## 2015-12-14 NOTE — ED Triage Notes (Addendum)
Pt. sts that since her [facility] and uterine ablation 2 months ago, she has been unable to hold food down with diarrhea after she eats and constant nausea and vomiting with insomnia relieved with ambien. Pt. sts she is under a lot of stress and appears tearful and anxious

## 2015-12-14 NOTE — ED Provider Notes (Signed)
[facility] DEPT Provider Note   CSN: [id number] Arrival date & time: 12/14/15  [id number]  First Provider Contact:  First MD Initiated Contact with Patient 12/14/15 1029        History   Chief Complaint Chief Complaint  Patient presents with  . Nausea  . Emesis    HPI Terri Hogan is a 50 y.o. female.  The history is provided by the patient.  Emesis   This is a chronic problem. The current episode started more than 1 week ago. The problem occurs 5 to 10 times per day. The problem has not changed since onset.The emesis has an appearance of stomach contents. There has been no fever. Associated symptoms include abdominal pain and diarrhea. Pertinent negatives include no arthralgias, no chills, no cough and no fever.    Past Medical History:  Diagnosis Date  . Anemia   . GERD (gastroesophageal reflux disease)    otc prn  . Insomnia   . Menorrhagia   . PONV (postoperative nausea and vomiting)   . SVD (spontaneous vaginal delivery)    x 2    Patient Active Problem List   Diagnosis Date Noted  . Generalized anxiety disorder 07/02/2015  . Current smoker 08/31/2014  . 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 Fibroids, intramural 08/31/2014  . Menorrhagia with regular cycle 08/12/2014    Past Surgical History:  Procedure Laterality Date  . CARPAL TUNNEL RELEASE Right   . CESAREAN SECTION     x 1  . CHOLECYSTECTOMY    . DILITATION & CURRETTAGE/HYSTROSCOPY WITH NOVASURE ABLATION N/A 10/22/2015   Procedure: DILATATION & CURETTAGE WITH NOVASURE ABLATION;  Surgeon: Emily Filbert, MD;  Location: [facility];  Service: Gynecology;  Laterality: N/A;  . TUBAL LIGATION      OB History    Gravida Para Term Preterm AB Living   4 4 4  0 0 3   SAB TAB Ectopic Multiple Live Births   0               Home Medications    Prior to Admission medications   Medication Sig Start Date End Date Taking? Authorizing Provider  ibuprofen (ADVIL,MOTRIN) 600 MG tablet Take 1 tablet (600 mg total) by  mouth every 6 (six) hours as needed. 10/22/15  Yes Emily Filbert, MD  loperamide (IMODIUM A-D) 2 MG capsule Take 2 mg by mouth as needed for diarrhea or loose stools.   Yes Historical Provider, MD  ranitidine (ZANTAC) 150 MG tablet Take 150 mg by mouth once as needed for heartburn.   Yes Historical Provider, MD  zolpidem (AMBIEN) 10 MG tablet TAKE 1 TABLET BY MOUTH AT BEDTIME AS NEEDED FOR SLEEP 09/30/15  Yes Josalyn Funches, MD  citalopram (CELEXA) 20 MG tablet Take 1 tablet (20 mg total) by mouth daily. Patient not taking: Reported on 08/11/2015 08/03/15   Boykin Nearing, MD  ferrous sulfate 325 (65 FE) MG tablet Take 1 tablet (325 mg total) by mouth 2 (two) times daily with a meal. Patient not taking: Reported on 08/11/2015 08/31/14   Boykin Nearing, MD  ondansetron (ZOFRAN ODT) 4 MG disintegrating tablet Take 1 tablet (4 mg total) by mouth every 8 (eight) hours as needed for nausea or vomiting. 12/14/15   Geronimo Boot, MD    Family History Family History  Problem Relation Age of Onset  . Diabetes Mother   . Cancer Father     cirrhosis    Social History Social History  Substance Use Topics  .  Smoking status: Current Every Day Smoker    Packs/day: 0.50    Years: 20.00    Types: Cigarettes  . Smokeless tobacco: Never Used  . Alcohol use Yes     Comment: occasionally beer     Allergies   Codeine   Review of Systems Review of Systems  Constitutional: Negative for chills and fever.  HENT: Negative for ear pain and sore throat.   Eyes: Negative for pain and visual disturbance.  Respiratory: Negative for cough and shortness of breath.   Cardiovascular: Negative for chest pain and palpitations.  Gastrointestinal: Positive for abdominal pain, diarrhea and vomiting.  Genitourinary: Negative for dysuria, flank pain, frequency, hematuria, vaginal bleeding and vaginal discharge.  Musculoskeletal: Negative for arthralgias and back pain.  Skin: Negative for color change and rash.    Neurological: Negative for seizures and syncope.  All other systems reviewed and are negative.    Physical Exam Updated Vital Signs BP 126/79 (BP Location: Right Arm)   Pulse 61   Temp 98.1 F (36.7 C) (Oral)   Resp 14   Ht 4' (1.219 m)   Wt 83.9 kg   SpO2 100%   BMI 56.45 kg/m   Physical Exam  Constitutional: She appears well-developed and well-nourished. No distress.  HENT:  Head: Normocephalic and atraumatic.  Eyes: Conjunctivae are normal.  Neck: Neck supple.  Cardiovascular: Normal rate and regular rhythm.   No murmur heard. Pulmonary/Chest: Effort normal and breath sounds normal. No respiratory distress.  Abdominal: Soft. There is tenderness in the right upper quadrant and periumbilical area. There is no rigidity, no rebound, no guarding, no CVA tenderness, no tenderness at McBurney's point and negative Murphy's sign.  Musculoskeletal: She exhibits no edema.  Neurological: She is alert.  Skin: Skin is warm and dry.  Psychiatric: She has a normal mood and affect.  Nursing note and vitals reviewed.   ED Treatments / Results  Labs (all labs ordered are listed, but only abnormal results are displayed) Labs Reviewed  COMPREHENSIVE METABOLIC PANEL - Abnormal; Notable for the following:       Result Value   Potassium 3.3 (*)    Glucose, Bld 140 (*)    All other components within normal limits  CBC - Abnormal; Notable for the following:    RBC 5.40 (*)    Hemoglobin 16.6 (*)    HCT 48.8 (*)    All other components within normal limits  URINALYSIS, ROUTINE W REFLEX MICROSCOPIC (NOT AT [facility]) - Abnormal; Notable for the following:    APPearance HAZY (*)    Hgb urine dipstick SMALL (*)    Leukocytes, UA TRACE (*)    All other components within normal limits  URINE MICROSCOPIC-ADD ON - Abnormal; Notable for the following:    Squamous Epithelial / LPF 6-30 (*)    Bacteria, UA MANY (*)    All other components within normal limits  C DIFFICILE QUICK SCREEN W PCR  REFLEX  URINE CULTURE  GASTROINTESTINAL PANEL BY PCR, STOOL (REPLACES STOOL CULTURE)  LIPASE, BLOOD    EKG  EKG Interpretation None       Radiology Ct Abdomen Pelvis W Contrast  Result Date: 12/14/2015 CLINICAL DATA:  Lower abdominal pain, intermittent over the last 2 months. Nausea and vomiting. Diarrhea. EXAM: CT ABDOMEN AND PELVIS WITH CONTRAST TECHNIQUE: Multidetector CT imaging of the abdomen and pelvis was performed using the standard protocol following bolus administration of intravenous contrast. CONTRAST:  80 cc Isovue-300 COMPARISON:  None. FINDINGS: Lung bases  are clear. No pleural or pericardial fluid. The liver has a normal appearance. Previous cholecystectomy. The spleen is normal. The pancreas is normal. The adrenal glands are normal. The kidneys are normal except for a 2 cm cyst at the upper pole on the left and a 1 cm cyst in the midportion on the left. The aorta and IVC are normal. Mild atherosclerosis at the proximal iliac arteries. No retroperitoneal mass or adenopathy. Uterus and adnexal regions are normal. No bowel pathology is seen. Normal appearing appendix. No bone abnormality. IMPRESSION: No cause of abdominal pain identified. Examination is normal except for previous cholecystectomy, 2 cm and 1 cm left renal cysts, and atherosclerosis of the proximal iliac arteries. Electronically Signed   By: Nelson Chimes M.D.   On: 12/14/2015 15:02   Procedures Procedures (including critical care time)  Medications Ordered in ED Medications  ondansetron (ZOFRAN) injection 4 mg (4 mg Intravenous Given 12/14/15 1015)  sodium chloride 0.9 % bolus 1,000 mL (0 mLs Intravenous Stopped 12/14/15 1321)  potassium chloride SA (K-DUR,KLOR-CON) CR tablet 40 mEq (40 mEq Oral Given 12/14/15 1106)  ondansetron (ZOFRAN) injection 4 mg (4 mg Intravenous Given 12/14/15 1221)  iopamidol (ISOVUE-300) 61 % injection (100 mLs  Contrast Given 12/14/15 1437)     Initial Impression / Assessment and  Plan / ED Course  I have reviewed the triage vital signs and the nursing notes.  Pertinent labs & imaging results that were available during my care of the patient were reviewed by me and considered in my medical decision making (see chart for details).  Clinical Course   Patient is a 50 year old female presenting to the emergency department for 2 months of increasing nausea vomiting and diarrhea. Patient also reports she is very anxious at this time and feels that her constant emesis may be related to her anxiety.  On evaluation the patient is imminently stable and in no acute distress. Does not appear grossly dehydrated on physical exam. CBC and lipase unremarkable.  CMP with mold hypokalemia. UA displays some bacteria but a symptomatically at this time. Urine culture sent. Due to diffuse abdominal tenderness and chronicity CT abdomen pelvis obtained. This displayed no acute findings to indicate etiology of the pain.  Discussed the patient's anxiety in depth and educated on following up with her PCP for possible medication changes as well as counseling and meditation exercises. She denies any SI or HI or AVH however.   Given patient Zofran in the emergency department with alleviation of nausea. Found to be mildly hypokalemic and given replacement orally in the ED which she tolerated.  Labs were viewed by myself  incorporated into medical decision making.  Discussed pertinent finding with patient or caregiver prior to discharge with no further questions.  Immediate return precautions given and understood.  Medical decision making supervised by my attending Dr. Venora Maples.   Geronimo Boot, MD PGY-3 Emergency Medicine   Final Clinical Impressions(s) / ED Diagnoses   Final diagnoses:  Nausea vomiting and diarrhea    New Prescriptions Discharge Medication List as of 12/14/2015  1:38 PM    START taking these medications   Details  ondansetron (ZOFRAN ODT) 4 MG disintegrating tablet Take 1  tablet (4 mg total) by mouth every 8 (eight) hours as needed for nausea or vomiting., Starting Tue 12/14/2015, Print         Geronimo Boot, MD 12/14/15 Josephine, MD 12/15/15 [phone]

## 2015-12-15 LAB — GASTROINTESTINAL PANEL BY PCR, STOOL (REPLACES STOOL CULTURE)
ADENOVIRUS F40/41: NOT DETECTED
Astrovirus: NOT DETECTED
CRYPTOSPORIDIUM: NOT DETECTED
CYCLOSPORA CAYETANENSIS: NOT DETECTED
Campylobacter species: NOT DETECTED
E. coli O157: NOT DETECTED
ENTAMOEBA HISTOLYTICA: NOT DETECTED
ENTEROPATHOGENIC E COLI (EPEC): NOT DETECTED
Enteroaggregative E coli (EAEC): NOT DETECTED
Enterotoxigenic E coli (ETEC): NOT DETECTED
Giardia lamblia: NOT DETECTED
Norovirus GI/GII: NOT DETECTED
Plesimonas shigelloides: NOT DETECTED
ROTAVIRUS A: NOT DETECTED
SAPOVIRUS (I, II, IV, AND V): NOT DETECTED
SHIGA LIKE TOXIN PRODUCING E COLI (STEC): NOT DETECTED
Salmonella species: NOT DETECTED
Shigella/Enteroinvasive E coli (EIEC): NOT DETECTED
VIBRIO CHOLERAE: NOT DETECTED
VIBRIO SPECIES: NOT DETECTED
YERSINIA ENTEROCOLITICA: NOT DETECTED

## 2015-12-15 LAB — URINE CULTURE

## 2016-02-14 ENCOUNTER — Other Ambulatory Visit: Payer: Self-pay | Admitting: Family Medicine

## 2016-02-14 DIAGNOSIS — F5102 Adjustment insomnia: Secondary | ICD-10-CM

## 2016-02-15 NOTE — Telephone Encounter (Signed)
Ambien  Ready for pick up Patient due for f/u appt please schedule

## 2016-02-24 ENCOUNTER — Encounter: Payer: Self-pay | Admitting: Family Medicine

## 2016-03-20 ENCOUNTER — Other Ambulatory Visit: Payer: Self-pay | Admitting: Family Medicine

## 2016-03-20 DIAGNOSIS — F5102 Adjustment insomnia: Secondary | ICD-10-CM

## 2016-03-23 NOTE — Telephone Encounter (Signed)
Ambien refilled

## 2016-03-24 NOTE — Telephone Encounter (Signed)
Pt was called on 11/03 and a VM was left informing pt of script being ready for pick up.

## 2016-06-25 ENCOUNTER — Other Ambulatory Visit: Payer: Self-pay | Admitting: Family Medicine

## 2016-06-25 DIAGNOSIS — F5102 Adjustment insomnia: Secondary | ICD-10-CM

## 2016-06-26 NOTE — Telephone Encounter (Signed)
Please inform patient Terri Hogan Rx ready for pick up She is due for f/u visit and advised to schedule

## 2016-07-28 ENCOUNTER — Other Ambulatory Visit: Payer: Self-pay | Admitting: Family Medicine

## 2016-07-28 DIAGNOSIS — F5102 Adjustment insomnia: Secondary | ICD-10-CM

## 2016-07-31 NOTE — Telephone Encounter (Signed)
ambien refill requested Patient last seen over 1 year ago Need f/u appt for any med refills

## 2016-08-16 ENCOUNTER — Telehealth: Payer: Self-pay | Admitting: Family Medicine

## 2016-08-16 NOTE — Telephone Encounter (Signed)
She has not been seen in over a year by Dr. Adrian Blackwater. She will need to get that at her appointment.

## 2016-08-16 NOTE — Telephone Encounter (Signed)
Zolpidem is a controlled substance - will forward to Dr. Jarold Song who is covering for Dr. Adrian Blackwater as I cannot refill this.

## 2016-08-16 NOTE — Telephone Encounter (Signed)
Pt calling to request a refill on her sleep medication. Has an appt set for 08/29/16 at 9:30 to re-est care but she states that she is out of her meds and hasn't slept in "awhile." Please f/u.

## 2016-08-29 ENCOUNTER — Ambulatory Visit: Payer: Self-pay | Admitting: Family Medicine

## 2017-09-28 ENCOUNTER — Other Ambulatory Visit: Payer: Self-pay

## 2017-09-28 ENCOUNTER — Emergency Department (HOSPITAL_COMMUNITY): Payer: Self-pay

## 2017-09-28 ENCOUNTER — Encounter (HOSPITAL_COMMUNITY): Payer: Self-pay | Admitting: *Deleted

## 2017-09-28 ENCOUNTER — Emergency Department (HOSPITAL_COMMUNITY)
Admission: EM | Admit: 2017-09-28 | Discharge: 2017-09-28 | Disposition: A | Discharge status: Home / Self Care | Payer: Self-pay | Attending: Emergency Medicine | Admitting: Emergency Medicine

## 2017-09-28 DIAGNOSIS — Y9289 Other specified places as the place of occurrence of the external cause: Secondary | ICD-10-CM | POA: Insufficient documentation

## 2017-09-28 DIAGNOSIS — Z79899 Other long term (current) drug therapy: Secondary | ICD-10-CM | POA: Insufficient documentation

## 2017-09-28 DIAGNOSIS — R030 Elevated blood-pressure reading, without diagnosis of hypertension: Secondary | ICD-10-CM | POA: Insufficient documentation

## 2017-09-28 DIAGNOSIS — W108XXA Fall (on) (from) other stairs and steps, initial encounter: Secondary | ICD-10-CM | POA: Insufficient documentation

## 2017-09-28 DIAGNOSIS — F17210 Nicotine dependence, cigarettes, uncomplicated: Secondary | ICD-10-CM | POA: Insufficient documentation

## 2017-09-28 DIAGNOSIS — Z23 Encounter for immunization: Secondary | ICD-10-CM | POA: Insufficient documentation

## 2017-09-28 DIAGNOSIS — S8011XA Contusion of right lower leg, initial encounter: Secondary | ICD-10-CM | POA: Insufficient documentation

## 2017-09-28 DIAGNOSIS — S93601A Unspecified sprain of right foot, initial encounter: Secondary | ICD-10-CM

## 2017-09-28 DIAGNOSIS — Y999 Unspecified external cause status: Secondary | ICD-10-CM | POA: Insufficient documentation

## 2017-09-28 DIAGNOSIS — Y9389 Activity, other specified: Secondary | ICD-10-CM | POA: Insufficient documentation

## 2017-09-28 MED ORDER — IBUPROFEN 600 MG PO TABS: 600.0000 mg | ORAL_TABLET | Freq: Four times a day (QID) | ORAL | 0 refills | Status: DC | PRN

## 2017-09-28 MED ORDER — ACETAMINOPHEN 500 MG PO TABS
1000.0000 mg | ORAL_TABLET | Freq: Once | ORAL | Status: AC
Start: 2017-09-28 — End: 2017-09-28
  Administered 2017-09-28: 1000 mg via ORAL
  Filled 2017-09-28: qty 2

## 2017-09-28 MED ORDER — TETANUS-DIPHTH-ACELL PERTUSSIS 5-2.5-18.5 LF-MCG/0.5 IM SUSP
0.5000 mL | Freq: Once | INTRAMUSCULAR | Status: AC
  Administered 2017-09-28: 0.5 mL via INTRAMUSCULAR
  Filled 2017-09-28: qty 0.5

## 2017-09-28 MED ORDER — ACETAMINOPHEN 325 MG PO TABS: 650.0000 mg | ORAL_TABLET | Freq: Four times a day (QID) | ORAL | 0 refills | Status: DC | PRN

## 2017-09-28 NOTE — ED Provider Notes (Signed)
[facility] EMERGENCY DEPARTMENT Provider Note   CSN: [id number] Arrival date & time: 09/28/17  7564     History   Chief Complaint Chief Complaint  Patient presents with  . Fall    HPI Terri Hogan is a 52 y.o. female.  HPI  Patient is a 52 year old female with a history of menorrhagia and anemia presenting for right leg pain status post fall.  Patient reports this occurred yesterday.  Patient reports that she slipped on cement steps, fell down onto her right side, and a cement block next to the stairwell fell onto her tibia and fibula.  Patient reports that she was ambulatory after the event, however she noticed increased bruising, pain, and swelling today.  Patient denies any weakness or numbness of the lower extremity.  Patient reports abrasions to her feet, as well as tenderness overlying the fifth metatarsal.  Patient reports she does not believe tetanus shot is up-to-date.  Past Medical History:  Diagnosis Date  . Anemia   . GERD (gastroesophageal reflux disease)    otc prn  . Insomnia   . Menorrhagia   . PONV (postoperative nausea and vomiting)   . SVD (spontaneous vaginal delivery)    x 2    Patient Active Problem List   Diagnosis Date Noted  . Generalized anxiety disorder 07/02/2015  . Current smoker 08/31/2014  . 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 Fibroids, intramural 08/31/2014  . Menorrhagia with regular cycle 08/12/2014    Past Surgical History:  Procedure Laterality Date  . CARPAL TUNNEL RELEASE Right   . CESAREAN SECTION     x 1  . CHOLECYSTECTOMY    . DILITATION & CURRETTAGE/HYSTROSCOPY WITH NOVASURE ABLATION N/A 10/22/2015   Procedure: DILATATION & CURETTAGE WITH NOVASURE ABLATION;  Surgeon: Emily Filbert, MD;  Location: [facility];  Service: Gynecology;  Laterality: N/A;  . TUBAL LIGATION       OB History    Gravida  4   Para  4   Term  4   Preterm  0   AB  0   Living  3     SAB  0   TAB      Ectopic      Multiple      Live Births               Home Medications    Prior to Admission medications   Medication Sig Start Date End Date Taking? Authorizing Provider  citalopram (CELEXA) 20 MG tablet Take 1 tablet (20 mg total) by mouth daily. Patient not taking: Reported on 08/11/2015 08/03/15   Boykin Nearing, MD  ferrous sulfate 325 (65 FE) MG tablet Take 1 tablet (325 mg total) by mouth 2 (two) times daily with a meal. Patient not taking: Reported on 08/11/2015 08/31/14   Boykin Nearing, MD  ibuprofen (ADVIL,MOTRIN) 600 MG tablet Take 1 tablet (600 mg total) by mouth every 6 (six) hours as needed. 10/22/15   Emily Filbert, MD  loperamide (IMODIUM A-D) 2 MG capsule Take 2 mg by mouth as needed for diarrhea or loose stools.    [provider]  ondansetron (ZOFRAN ODT) 4 MG disintegrating tablet Take 1 tablet (4 mg total) by mouth every 8 (eight) hours as needed for nausea or vomiting. 12/14/15   Geronimo Boot, MD  ranitidine (ZANTAC) 150 MG tablet Take 150 mg by mouth once as needed for heartburn.    [provider]  zolpidem (AMBIEN) 10 MG  tablet TAKE 1 TABLET BY MOUTH AT BEDTIME AS NEEDED FOR SLEEP 06/26/16   Boykin Nearing, MD    Family History Family History  Problem Relation Age of Onset  . Diabetes Mother   . Cancer Father        cirrhosis    Social History Social History   Tobacco Use  . Smoking status: Current Every Day Smoker    Packs/day: 0.50    Years: 20.00    Pack years: 10.00    Types: Cigarettes  . Smokeless tobacco: Never Used  Substance Use Topics  . Alcohol use: Yes    Comment: occasionally beer  . Drug use: No     Allergies   Codeine   Review of Systems Review of Systems  Musculoskeletal: Positive for arthralgias and joint swelling. Negative for back pain.  Skin: Positive for wound.  Neurological: Negative for weakness and numbness.     Physical Exam Updated Vital Signs BP (!) 145/92 (BP Location: Right Arm)   Pulse 83    Temp 98.7 F (37.1 C) (Oral)   Resp 14   Ht 5' 4.5" (1.638 m)   Wt 83.9 kg (185 lb)   SpO2 99%   BMI 31.26 kg/m   Physical Exam  Constitutional: She appears well-developed and well-nourished. No distress.  Sitting comfortably in bed.  HENT:  Head: Normocephalic and atraumatic.  Eyes: Conjunctivae are normal. Right eye exhibits no discharge. Left eye exhibits no discharge.  EOMs normal to gross examination.  Neck: Normal range of motion.  Cardiovascular: Normal rate and regular rhythm.  Intact, 2+ DP/PT pulse of right lower extremity.  Pulmonary/Chest:  Normal respiratory effort. Patient converses comfortably. No audible wheeze or stridor.  Abdominal: She exhibits no distension.  Musculoskeletal: Normal range of motion.  Right calf with soft compartments, but hematoma overlying tibia and fibula.  Mild soft tissue swelling. Right ankle with no tenderness to palpation lateral / medial malleolus.  No swelling.   Full ROM.  No ecchymosis or deformity, but abrasion noted to right fifth digit of right lower extremity.  Tenderness overlying fifth meta tarsal.  No break in skin. No pain to fifth metatarsal area or navicular region. Achilles intact per Thompson's test. Good pedal pulse and cap refill of toes. Sensation intact to light touch distally.   Neurological: She is alert.  Cranial nerves intact to gross observation. Patient moves extremities without difficulty.  Skin: Skin is warm and dry. She is not diaphoretic.  Psychiatric: She has a normal mood and affect. Her behavior is normal. Judgment and thought content normal.  Nursing note and vitals reviewed.    ED Treatments / Results  Labs (all labs ordered are listed, but only abnormal results are displayed) Labs Reviewed - No data to display  EKG None  Radiology Dg Tibia/fibula Right  Result Date: 09/28/2017 CLINICAL DATA:  Leg injury with pain. EXAM: RIGHT TIBIA AND FIBULA - 2 VIEW COMPARISON:  None. FINDINGS: No evidence  for an acute fracture. No subluxation or dislocation. Prominent nutrient foramen noted mid diaphysis of the fibula. IMPRESSION: Negative. Electronically Signed   By: Misty Stanley M.D.   On: 09/28/2017 11:23    Procedures Procedures (including critical care time)  Medications Ordered in ED Medications  acetaminophen (TYLENOL) tablet 1,000 mg (has no administration in time range)     Initial Impression / Assessment and Plan / ED Course  I have reviewed the triage vital signs and the nursing notes.  Pertinent labs & imaging results that  were available during my care of the patient were reviewed by me and considered in my medical decision making (see chart for details).  Clinical Course as of Sep 29 1351  Fri Sep 28, 2017  1342 Patient declines pregnancy test.  Patient reports that there is no way she is pregnant, and I discussed risks and benefits of taking ibuprofen if there is any chance that she could be pregnant.   [AM]    Clinical Course User Index [AM] Albesa Seen, PA-C    52 year old female with right lower extremity injury second to falling down stairs.  No head or neck trauma.  No signs of fracture on tib-fib x-ray, and foot x-ray.  No tenderness over medial lateral malleolus warranting dedicated ankle films.  Extremities neurovascularly intact, with evidence of hematoma overlying the tibia and fibula and contusion.  Patient ambulatory with antalgic gait with no complaints at discharge.  Return precautions given for any changes in neurologic sensation of the lower extremity, or pallor.  Patient counseled on the expected color changes distal to a contusion injury.  Patient in understanding and agrees with the plan of care.  PCP follow-up.  Final Clinical Impressions(s) / ED Diagnoses   Final diagnoses:  Contusion of right tibia  Sprain of right foot, initial encounter  Elevated blood pressure reading without diagnosis of hypertension    ED Discharge Orders         Ordered    acetaminophen (TYLENOL) 325 MG tablet  Every 6 hours PRN     09/28/17 1339    ibuprofen (ADVIL,MOTRIN) 600 MG tablet  Every 6 hours PRN     09/28/17 1339       Albesa Seen, PA-C 09/28/17 1355    Virgel Manifold, MD 10/01/17 [phone]

## 2017-09-28 NOTE — ED Notes (Signed)
Patient verbalizes understanding of discharge instructions. Opportunity for questioning and answers were provided. Armband removed by staff, pt discharged from ED ambulatory.   

## 2017-09-28 NOTE — ED Notes (Signed)
Patient transported to X-ray 

## 2017-09-28 NOTE — ED Triage Notes (Signed)
States she fell down her stairs yest. And some cement blocks fell on her right leg

## 2017-09-28 NOTE — Discharge Instructions (Signed)
Please see the information and instructions below regarding your visit.  Your diagnoses today include:  1. Contusion of right tibia   2. Sprain of right foot, initial encounter   3. Elevated blood pressure reading without diagnosis of hypertension    Tests performed today include: See side panel of your discharge paperwork for testing performed today. Vital signs are listed at the bottom of these instructions.   Medications prescribed:    Take any prescribed medications only as prescribed, and any over the counter medications only as directed on the packaging.  You are prescribed ibuprofen, a non-steroidal anti-inflammatory agent (NSAID) for pain. You may take 600mg  every 6 hours as needed for pain. If still requiring this medication around the clock for acute pain after 10 days, please see your primary healthcare provider.  Women who are pregnant, breastfeeding, or planning on becoming pregnant should not take non-steroidal anti-inflammatories such as Aleve or Advil.  You may combine this medication with Tylenol, 650 mg every 6 hours, so you are receiving something for pain every 3 hours.  This is not a long-term medication unless under the care and direction of your primary provider. Taking this medication long-term and not under the supervision of a healthcare provider could increase the risk of stomach ulcers, kidney problems, and cardiovascular problems such as high blood pressure.    Home care instructions:  Please follow any educational materials contained in this packet.   Please perform cold therapy as indicated in the packet.  Please ice for 20 minutes on, 20 minutes off but make sure there is a barrier in between your skin and the ice.  Please elevate this extremity when you are not up and on your feet.    Please keep the foot/leg wrapped in an Ace wrap  Follow-up instructions: Please follow-up with your primary care provider in one 1 week for further evaluation of your  symptoms if they are not completely improved.   Return instructions:  Please return to the Emergency Department if you experience worsening symptoms.  Please return to the emergency department for any increasing pain, change in color of the lower extremity where it is turning white, inability to feel the lower extremity, weakness of the foot or numbness of the foot. Please return if you have any other emergent concerns.  Additional Information:   Your vital signs today were: BP (!) 145/92 (BP Location: Right Arm)    Pulse 83    Temp 98.7 F (37.1 C) (Oral)    Resp 14    Ht 5' 4.5" (1.638 m)    Wt 83.9 kg (185 lb)    SpO2 99%    BMI 31.26 kg/m  If your blood pressure (BP) was elevated on multiple readings during this visit above 130 for the top number or above 80 for the bottom number, please have this repeated by your primary care provider within one month. --------------  Thank you for allowing [place] to participate in your care today.

## 2017-09-28 NOTE — ED Notes (Signed)
Pt states she has no trouble walking, it just hurts. Pt ambulatory with limp to ED room from waiting room

## 2017-12-12 ENCOUNTER — Emergency Department: Payer: Self-pay

## 2017-12-12 ENCOUNTER — Other Ambulatory Visit: Payer: Self-pay

## 2017-12-12 ENCOUNTER — Inpatient Hospital Stay
Admission: EM | Admit: 2017-12-12 | Discharge: 2017-12-14 | DRG: 871 | Disposition: A | Discharge status: Home / Self Care | Payer: Self-pay | Attending: Internal Medicine | Admitting: Internal Medicine

## 2017-12-12 DIAGNOSIS — K219 Gastro-esophageal reflux disease without esophagitis: Secondary | ICD-10-CM | POA: Diagnosis present

## 2017-12-12 DIAGNOSIS — J9601 Acute respiratory failure with hypoxia: Secondary | ICD-10-CM | POA: Diagnosis present

## 2017-12-12 DIAGNOSIS — Z79899 Other long term (current) drug therapy: Secondary | ICD-10-CM

## 2017-12-12 DIAGNOSIS — A419 Sepsis, unspecified organism: Principal | ICD-10-CM | POA: Diagnosis present

## 2017-12-12 DIAGNOSIS — J189 Pneumonia, unspecified organism: Secondary | ICD-10-CM | POA: Diagnosis present

## 2017-12-12 DIAGNOSIS — F17210 Nicotine dependence, cigarettes, uncomplicated: Secondary | ICD-10-CM | POA: Diagnosis present

## 2017-12-12 DIAGNOSIS — F419 Anxiety disorder, unspecified: Secondary | ICD-10-CM | POA: Diagnosis present

## 2017-12-12 DIAGNOSIS — Z885 Allergy status to narcotic agent status: Secondary | ICD-10-CM

## 2017-12-12 LAB — CBC
HCT: 45.9 % (ref 35.0–47.0)
HEMOGLOBIN: 16.2 g/dL — AB (ref 12.0–16.0)
MCH: 32.5 pg (ref 26.0–34.0)
MCHC: 35.2 g/dL (ref 32.0–36.0)
MCV: 92.1 fL (ref 80.0–100.0)
Platelets: 328 10*3/uL (ref 150–440)
RBC: 4.98 MIL/uL (ref 3.80–5.20)
RDW: 13.1 % (ref 11.5–14.5)
WBC: 10.9 10*3/uL (ref 3.6–11.0)

## 2017-12-12 LAB — URINALYSIS, COMPLETE (UACMP) WITH MICROSCOPIC
BACTERIA UA: NONE SEEN
Bilirubin Urine: NEGATIVE
GLUCOSE, UA: NEGATIVE mg/dL
KETONES UR: NEGATIVE mg/dL
Leukocytes, UA: NEGATIVE
Nitrite: NEGATIVE
PROTEIN: NEGATIVE mg/dL
Specific Gravity, Urine: 1.019 (ref 1.005–1.030)
pH: 7 (ref 5.0–8.0)

## 2017-12-12 LAB — BASIC METABOLIC PANEL
ANION GAP: 11 (ref 5–15)
BUN: 9 mg/dL (ref 6–20)
CALCIUM: 8.7 mg/dL — AB (ref 8.9–10.3)
CHLORIDE: 97 mmol/L — AB (ref 98–111)
CO2: 25 mmol/L (ref 22–32)
Creatinine, Ser: 0.97 mg/dL (ref 0.44–1.00)
GFR calc Af Amer: >60 mL/min (ref >60)
GFR calc non Af Amer: >60 mL/min (ref >60)
Glucose, Bld: 155 mg/dL — ABNORMAL HIGH (ref 70–99)
Potassium: 3.8 mmol/L (ref 3.5–5.1)
Sodium: 133 mmol/L — ABNORMAL LOW (ref 135–145)

## 2017-12-12 LAB — LACTIC ACID, PLASMA: LACTIC ACID, VENOUS: 2.4 mmol/L — AB (ref 0.5–1.9)

## 2017-12-12 LAB — TROPONIN I

## 2017-12-12 LAB — STREP PNEUMONIAE URINARY ANTIGEN: Strep Pneumo Urinary Antigen: NEGATIVE

## 2017-12-12 MED ORDER — SODIUM CHLORIDE 0.9 % IV BOLUS
1000.0000 mL | Freq: Once | INTRAVENOUS | Status: AC
  Administered 2017-12-12: 20:00:00 1000 mL via INTRAVENOUS

## 2017-12-12 MED ORDER — NICOTINE 14 MG/24HR TD PT24
14.0000 mg | MEDICATED_PATCH | Freq: Every day | TRANSDERMAL | Status: DC
  Administered 2017-12-12 – 2017-12-14 (×3): 14 mg via TRANSDERMAL
  Filled 2017-12-12 (×3): qty 1

## 2017-12-12 MED ORDER — ACETAMINOPHEN 325 MG PO TABS: 650.0000 mg | ORAL_TABLET | Freq: Four times a day (QID) | ORAL | Status: DC | PRN

## 2017-12-12 MED ORDER — FENTANYL CITRATE (PF) 100 MCG/2ML IJ SOLN
100.0000 ug | Freq: Once | INTRAMUSCULAR | Status: AC
  Administered 2017-12-12: 100 ug via INTRAVENOUS
  Filled 2017-12-12: qty 2

## 2017-12-12 MED ORDER — IOPAMIDOL (ISOVUE-370) INJECTION 76%
100.0000 mL | Freq: Once | INTRAVENOUS | Status: AC | PRN
  Administered 2017-12-12: 100 mL via INTRAVENOUS

## 2017-12-12 MED ORDER — HYDROCODONE-ACETAMINOPHEN 7.5-325 MG PO TABS
1.0000 | ORAL_TABLET | Freq: Four times a day (QID) | ORAL | Status: DC | PRN
  Administered 2017-12-12 – 2017-12-14 (×5): 1 via ORAL
  Filled 2017-12-12 (×5): qty 1

## 2017-12-12 MED ORDER — IPRATROPIUM-ALBUTEROL 0.5-2.5 (3) MG/3ML IN SOLN
3.0000 mL | Freq: Once | RESPIRATORY_TRACT | Status: AC
  Administered 2017-12-12: 3 mL via RESPIRATORY_TRACT

## 2017-12-12 MED ORDER — SODIUM CHLORIDE 0.9 % IV BOLUS
1000.0000 mL | Freq: Once | INTRAVENOUS | Status: AC
  Administered 2017-12-12: 1000 mL via INTRAVENOUS

## 2017-12-12 MED ORDER — METHYLPREDNISOLONE SODIUM SUCC 125 MG IJ SOLR
125.0000 mg | Freq: Once | INTRAMUSCULAR | Status: AC
  Administered 2017-12-12: 125 mg via INTRAVENOUS
  Filled 2017-12-12: qty 2

## 2017-12-12 MED ORDER — AZITHROMYCIN 500 MG PO TABS
500.0000 mg | ORAL_TABLET | Freq: Every day | ORAL | Status: DC
Start: 2017-12-13 — End: 2017-12-14
  Administered 2017-12-13 – 2017-12-14 (×2): 500 mg via ORAL
  Filled 2017-12-12 (×2): qty 1

## 2017-12-12 MED ORDER — ENOXAPARIN SODIUM 40 MG/0.4ML ~~LOC~~ SOLN
40.0000 mg | SUBCUTANEOUS | Status: DC
  Filled 2017-12-12: qty 0.4

## 2017-12-12 MED ORDER — IBUPROFEN 400 MG PO TABS
400.0000 mg | ORAL_TABLET | Freq: Four times a day (QID) | ORAL | Status: DC | PRN
  Administered 2017-12-12: 400 mg via ORAL
  Filled 2017-12-12: qty 1

## 2017-12-12 MED ORDER — SODIUM CHLORIDE 0.9 % IV SOLN
500.0000 mg | Freq: Once | INTRAVENOUS | Status: AC
  Administered 2017-12-12: 500 mg via INTRAVENOUS
  Filled 2017-12-12: qty 500

## 2017-12-12 MED ORDER — IOPAMIDOL (ISOVUE-370) INJECTION 76%: 75.0000 mL | Freq: Once | INTRAVENOUS | Status: DC | PRN

## 2017-12-12 MED ORDER — ONDANSETRON HCL 4 MG/2ML IJ SOLN
4.0000 mg | Freq: Once | INTRAMUSCULAR | Status: AC
Start: 2017-12-12 — End: 2017-12-12
  Administered 2017-12-12: 4 mg via INTRAVENOUS
  Filled 2017-12-12: qty 2

## 2017-12-12 MED ORDER — SODIUM CHLORIDE 0.9 % IV SOLN
1.0000 g | Freq: Once | INTRAVENOUS | Status: AC
  Administered 2017-12-12: 1 g via INTRAVENOUS
  Filled 2017-12-12: qty 10

## 2017-12-12 MED ORDER — SODIUM CHLORIDE 0.9 % IV SOLN
1.0000 g | INTRAVENOUS | Status: DC
  Administered 2017-12-13: 1 g via INTRAVENOUS
  Filled 2017-12-12: qty 1
  Filled 2017-12-12: qty 10

## 2017-12-12 MED ORDER — METHYLPREDNISOLONE SODIUM SUCC 125 MG IJ SOLR
60.0000 mg | Freq: Two times a day (BID) | INTRAMUSCULAR | Status: DC
  Administered 2017-12-13 – 2017-12-14 (×3): 60 mg via INTRAVENOUS
  Filled 2017-12-12 (×3): qty 2

## 2017-12-12 MED ORDER — IPRATROPIUM-ALBUTEROL 0.5-2.5 (3) MG/3ML IN SOLN
3.0000 mL | Freq: Four times a day (QID) | RESPIRATORY_TRACT | Status: DC
  Administered 2017-12-12 – 2017-12-14 (×8): 3 mL via RESPIRATORY_TRACT
  Filled 2017-12-12 (×9): qty 3

## 2017-12-12 MED ORDER — ACETAMINOPHEN 650 MG RE SUPP: 650.0000 mg | Freq: Four times a day (QID) | RECTAL | Status: DC | PRN

## 2017-12-12 MED ORDER — ONDANSETRON HCL 4 MG PO TABS: 4.0000 mg | ORAL_TABLET | Freq: Four times a day (QID) | ORAL | Status: DC | PRN

## 2017-12-12 MED ORDER — ALBUTEROL SULFATE (2.5 MG/3ML) 0.083% IN NEBU
2.5000 mg | INHALATION_SOLUTION | RESPIRATORY_TRACT | Status: DC | PRN
  Administered 2017-12-12 – 2017-12-14 (×2): 2.5 mg via RESPIRATORY_TRACT
  Filled 2017-12-12 (×2): qty 3

## 2017-12-12 MED ORDER — GUAIFENESIN 100 MG/5ML PO SOLN
5.0000 mL | ORAL | Status: DC | PRN
  Administered 2017-12-12 – 2017-12-13 (×3): 100 mg via ORAL
  Filled 2017-12-12 (×4): qty 5

## 2017-12-12 MED ORDER — POLYETHYLENE GLYCOL 3350 17 G PO PACK: 17.0000 g | PACK | Freq: Every day | ORAL | Status: DC | PRN

## 2017-12-12 MED ORDER — ONDANSETRON HCL 4 MG/2ML IJ SOLN: 4.0000 mg | Freq: Four times a day (QID) | INTRAMUSCULAR | Status: DC | PRN

## 2017-12-12 MED ORDER — IPRATROPIUM-ALBUTEROL 0.5-2.5 (3) MG/3ML IN SOLN
3.0000 mL | Freq: Once | RESPIRATORY_TRACT | Status: AC
  Administered 2017-12-12: 3 mL via RESPIRATORY_TRACT
  Filled 2017-12-12: qty 6

## 2017-12-12 NOTE — H&P (Addendum)
[facility] NAME: Terri Hogan    MR#:  [id number]  DATE OF BIRTH:  [birth year]  DATE OF ADMISSION:  12/12/2017  PRIMARY CARE PHYSICIAN: Patient, No Pcp Per   REQUESTING/REFERRING PHYSICIAN:   CHIEF COMPLAINT:   Chief Complaint  Patient presents with  . Shortness of Breath    HISTORY OF PRESENT ILLNESS:  Terri Hogan  is a 52 y.o. female with a known history of tobacco use, recurrent bronchitis episodes presents to the hospital complaining of 2 weeks of shortness of breath, coughing, chest congestion.  She initially had clear sputum which has turned green.  No blood.  She also complains of pleuritic chest pain.  Oxygen saturations have been dipping into the 80s at times.  Patient has been found to be tachycardic, tachypnea, sepsis present on admission.  CT scan of the chest showed no PE but bilateral pneumonia.  She has had exposure to her son at home who also had pneumonia recently.  Patient continues to smoke. No diagnosed COPD but has bronchitis once or twice in the year.  PAST MEDICAL HISTORY:   Past Medical History:  Diagnosis Date  . Anemia   . GERD (gastroesophageal reflux disease)    otc prn  . Insomnia   . Menorrhagia   . PONV (postoperative nausea and vomiting)   . SVD (spontaneous vaginal delivery)    x 2    PAST SURGICAL HISTORY:   Past Surgical History:  Procedure Laterality Date  . CARPAL TUNNEL RELEASE Right   . CESAREAN SECTION     x 1  . CHOLECYSTECTOMY    . DILITATION & CURRETTAGE/HYSTROSCOPY WITH NOVASURE ABLATION N/A 10/22/2015   Procedure: DILATATION & CURETTAGE WITH NOVASURE ABLATION;  Surgeon: Emily Filbert, MD;  Location: [facility];  Service: Gynecology;  Laterality: N/A;  . TUBAL LIGATION      SOCIAL HISTORY:   Social History   Tobacco Use  . Smoking status: Current Every Day Smoker    Packs/day: 0.50    Years: 20.00    Pack years: 10.00    Types: Cigarettes  . Smokeless tobacco: Never Used   Substance Use Topics  . Alcohol use: Yes    Comment: occasionally beer    FAMILY HISTORY:   Family History  Problem Relation Age of Onset  . Diabetes Mother   . Cancer Father        cirrhosis    DRUG ALLERGIES:   Allergies  Allergen Reactions  . Codeine Hives, Itching and Nausea And Vomiting    REVIEW OF SYSTEMS:   Review of Systems  Constitutional: Positive for chills, fever and malaise/fatigue.  HENT: Negative for sore throat.   Eyes: Negative for blurred vision, double vision and pain.  Respiratory: Positive for cough, sputum production, shortness of breath and wheezing. Negative for hemoptysis.   Cardiovascular: Negative for chest pain, palpitations, orthopnea and leg swelling.  Gastrointestinal: Negative for abdominal pain, constipation, diarrhea, heartburn, nausea and vomiting.  Genitourinary: Negative for dysuria and hematuria.  Musculoskeletal: Positive for myalgias. Negative for back pain and joint pain.  Skin: Negative for rash.  Neurological: Negative for sensory change, speech change, focal weakness and headaches.  Endo/Heme/Allergies: Does not bruise/bleed easily.  Psychiatric/Behavioral: Negative for depression. The patient is not nervous/anxious.     MEDICATIONS AT HOME:   Prior to Admission medications   Medication Sig Start Date End Date Taking? Authorizing Provider  acetaminophen (TYLENOL) 325 MG tablet Take 2 tablets (650  mg total) by mouth every 6 (six) hours as needed. 09/28/17  Yes Valere Dross, Alyssa B, PA-C  loperamide (IMODIUM A-D) 2 MG capsule Take 2 mg by mouth as needed for diarrhea or loose stools.   Yes [provider]  ranitidine (ZANTAC) 150 MG tablet Take 150 mg by mouth once as needed for heartburn.   Yes [provider]  ibuprofen (ADVIL,MOTRIN) 600 MG tablet Take 1 tablet (600 mg total) by mouth every 6 (six) hours as needed. Patient not taking: Reported on 12/12/2017 09/28/17   Langston Masker B, PA-C     VITAL SIGNS:   Blood pressure (!) 122/99, pulse (!) 135, temperature 100 F (37.8 C), temperature source Oral, resp. rate (!) 22, height 5\' 4"  (1.626 m), weight 83.9 kg (185 lb), SpO2 97 %.  PHYSICAL EXAMINATION:  Physical Exam  GENERAL:  52 y.o.-year-old patient lying in the bed with no acute distress.  EYES: Pupils equal, round, reactive to light and accommodation. No scleral icterus. Extraocular muscles intact.  HEENT: Head atraumatic, normocephalic. Oropharynx and nasopharynx clear. No oropharyngeal erythema, moist oral mucosa  NECK:  Supple, no jugular venous distention. No thyroid enlargement, no tenderness.  LUNGS: Increased work of breathing.  Bilateral wheezing.  Decreased air entry CARDIOVASCULAR: S1, S2, tachycardia.  No murmur. ABDOMEN: Soft, nontender, nondistended. Bowel sounds present. No organomegaly or mass.  EXTREMITIES: No pedal edema, cyanosis, or clubbing. + 2 pedal & radial pulses b/l.   NEUROLOGIC: Cranial nerves II through XII are intact. No focal Motor or sensory deficits appreciated b/l PSYCHIATRIC: The patient is alert and oriented x 3. Good affect.  SKIN: No obvious rash, lesion, or ulcer.   LABORATORY PANEL:   CBC Recent Labs  Lab 12/12/17 1315  WBC 10.9  HGB 16.2*  HCT 45.9  PLT 328   ------------------------------------------------------------------------------------------------------------------  Chemistries  Recent Labs  Lab 12/12/17 1315  NA 133*  K 3.8  CL 97*  CO2 25  GLUCOSE 155*  BUN 9  CREATININE 0.97  CALCIUM 8.7*   ------------------------------------------------------------------------------------------------------------------  Cardiac Enzymes Recent Labs  Lab 12/12/17 1315  TROPONINI <0.03   ------------------------------------------------------------------------------------------------------------------  RADIOLOGY:  Dg Chest 2 View  Result Date: 12/12/2017 CLINICAL DATA:  Productive cough and fever EXAM: CHEST - 2 VIEW  COMPARISON:  06/18/2014 FINDINGS: Cardiac shadow is stable. The lungs are well aerated bilaterally. No focal infiltrate or sizable effusion is seen. No acute bony abnormality is noted. IMPRESSION: No active cardiopulmonary disease. Electronically Signed   By: Inez Catalina M.D.   On: 12/12/2017 14:02   Ct Angio Chest Pe W And/or Wo Contrast  Result Date: 12/12/2017 CLINICAL DATA:  Productive cough. Left lower quadrant abdominal pain. EXAM: CT ANGIOGRAPHY CHEST CT ABDOMEN AND PELVIS WITH CONTRAST TECHNIQUE: Multidetector CT imaging of the chest was performed using the standard protocol during bolus administration of intravenous contrast. Multiplanar CT image reconstructions and MIPs were obtained to evaluate the vascular anatomy. Multidetector CT imaging of the abdomen and pelvis was performed using the standard protocol during bolus administration of intravenous contrast. CONTRAST:  146mL ISOVUE-370 IOPAMIDOL (ISOVUE-370) INJECTION 76% COMPARISON:  Chest x-ray dated 12/12/2017 FINDINGS: CTA CHEST FINDINGS Cardiovascular: Satisfactory opacification of the pulmonary arteries to the segmental level. No evidence of pulmonary embolism. Normal heart size. No pericardial effusion. Mediastinum/Nodes: Multiple small bilateral hilar and mediastinal lymph nodes. Trachea, thyroid gland, and esophagus appear normal. Lungs/Pleura: Extensive bilateral peribronchial thickening with small patchy areas of infiltrate in the right upper and lower lobes and in the left upper and  lower lobes. No effusions. Musculoskeletal: No chest wall abnormality. No acute or significant osseous findings. Review of the MIP images confirms the above findings. CT ABDOMEN and PELVIS FINDINGS Hepatobiliary: No focal liver abnormality is seen. Status post cholecystectomy. No biliary dilatation. Pancreas: Unremarkable. No pancreatic ductal dilatation or surrounding inflammatory changes. Spleen: Normal in size without focal abnormality. Adrenals/Urinary  Tract: Normal adrenal glands. 19 mm simple cyst on the upper pole of the left kidney. Kidneys are otherwise normal. No hydronephrosis. Normal bladder. Stomach/Bowel: There multiple slightly distended loops of small bowel without a discrete transition point. Colon appears normal. Terminal ileum and appendix are normal. 8 mm density in the cecum adjacent to the base of the appendix is probably ingested tablet rather than an appendicolith. Vascular/Lymphatic: No significant vascular findings are present. No enlarged abdominal or pelvic lymph nodes. Reproductive: 22 mm cyst on the right ovary.  Otherwise normal. Other: No abdominal wall hernia or abnormality. No abdominopelvic ascites. Musculoskeletal: No acute or significant osseous findings. Review of the MIP images confirms the above findings. IMPRESSION: 1. Multiple small patchy areas of infiltrate in both lungs. 2. Slight nonspecific dilatation of multiple small bowel loops without a discrete transition point. This could represent enteritis. Electronically Signed   By: Lorriane Shire M.D.   On: 12/12/2017 15:20   Ct Abdomen Pelvis W Contrast  Result Date: 12/12/2017 CLINICAL DATA:  Productive cough. Left lower quadrant abdominal pain. EXAM: CT ANGIOGRAPHY CHEST CT ABDOMEN AND PELVIS WITH CONTRAST TECHNIQUE: Multidetector CT imaging of the chest was performed using the standard protocol during bolus administration of intravenous contrast. Multiplanar CT image reconstructions and MIPs were obtained to evaluate the vascular anatomy. Multidetector CT imaging of the abdomen and pelvis was performed using the standard protocol during bolus administration of intravenous contrast. CONTRAST:  189mL ISOVUE-370 IOPAMIDOL (ISOVUE-370) INJECTION 76% COMPARISON:  Chest x-ray dated 12/12/2017 FINDINGS: CTA CHEST FINDINGS Cardiovascular: Satisfactory opacification of the pulmonary arteries to the segmental level. No evidence of pulmonary embolism. Normal heart size. No  pericardial effusion. Mediastinum/Nodes: Multiple small bilateral hilar and mediastinal lymph nodes. Trachea, thyroid gland, and esophagus appear normal. Lungs/Pleura: Extensive bilateral peribronchial thickening with small patchy areas of infiltrate in the right upper and lower lobes and in the left upper and lower lobes. No effusions. Musculoskeletal: No chest wall abnormality. No acute or significant osseous findings. Review of the MIP images confirms the above findings. CT ABDOMEN and PELVIS FINDINGS Hepatobiliary: No focal liver abnormality is seen. Status post cholecystectomy. No biliary dilatation. Pancreas: Unremarkable. No pancreatic ductal dilatation or surrounding inflammatory changes. Spleen: Normal in size without focal abnormality. Adrenals/Urinary Tract: Normal adrenal glands. 19 mm simple cyst on the upper pole of the left kidney. Kidneys are otherwise normal. No hydronephrosis. Normal bladder. Stomach/Bowel: There multiple slightly distended loops of small bowel without a discrete transition point. Colon appears normal. Terminal ileum and appendix are normal. 8 mm density in the cecum adjacent to the base of the appendix is probably ingested tablet rather than an appendicolith. Vascular/Lymphatic: No significant vascular findings are present. No enlarged abdominal or pelvic lymph nodes. Reproductive: 22 mm cyst on the right ovary.  Otherwise normal. Other: No abdominal wall hernia or abnormality. No abdominopelvic ascites. Musculoskeletal: No acute or significant osseous findings. Review of the MIP images confirms the above findings. IMPRESSION: 1. Multiple small patchy areas of infiltrate in both lungs. 2. Slight nonspecific dilatation of multiple small bowel loops without a discrete transition point. This could represent enteritis. Electronically Signed   By:  Lorriane Shire M.D.   On: 12/12/2017 15:20     IMPRESSION AND PLAN:   *Bilateral pneumonia with acute hypoxic respiratory failure and  sepsis present on admission Start IV ceftriaxone and azithromycin.  Due to wheezing we will also start IV Solu-Medrol.  Scheduled nebulizers.  Albuterol as needed.  Blood cultures sent and pending. Orders per pneumonia order set Check lactic acid.  Bolus IV fluid normal saline stat.  Critically ill with sepsis.  *Tobacco use.  Counseled patient to quit smoking.  Greater than 3 minutes spent.  All the records are reviewed and case discussed with ED provider. Management plans discussed with the patient, family and they are in agreement.  CODE STATUS: FULL CODE  TOTAL CC TIME TAKING CARE OF THIS PATIENT: 40 minutes.   Leia Alf Otto Caraway M.D on 12/12/2017 at 4:08 PM  Between 7am to 6pm - Pager - [phone]  After 6pm go to [email or web address] - password [email or web address] [facility]  Office  [phone]  CC: Primary care physician; Patient, No Pcp Per  Note: This dictation was prepared with Dragon dictation along with smaller phrase technology. Any transcriptional errors that result from this process are unintentional.

## 2017-12-12 NOTE — ED Notes (Signed)
Patient transported to CT 

## 2017-12-12 NOTE — ED Provider Notes (Addendum)
[facility] Emergency Department Provider Note  Time seen: 1:38 PM  I have reviewed the triage vital signs and the nursing notes.   HISTORY  Chief Complaint Shortness of Breath    HPI Terri Hogan is a 52 y.o. female with a past medical history of anemia, gastric reflux, presents to the emergency department with symptoms of cough, fever, difficulty breathing, left flank pain, loose stool.  According to the patient for the past 1 to 2 weeks she has had a cough, states has progressively worsened over the past several days, intermittent fever and chills.  Frequent cough states she is so tired.  Is thirsty but states she cannot drink because she drinks and it causes her to cough.  Patient states every time she coughs she was sometimes urinate on herself or have a loose bowel movement.  Patient smokes cigarettes daily but denies any underlying COPD diagnosed.  Upon arrival patient has frequent cough, tearful, tachycardic and tachypneic.  Also complaining of left flank pain which started today but only when coughing.   Past Medical History:  Diagnosis Date  . Anemia   . GERD (gastroesophageal reflux disease)    otc prn  . Insomnia   . Menorrhagia   . PONV (postoperative nausea and vomiting)   . SVD (spontaneous vaginal delivery)    x 2    Patient Active Problem List   Diagnosis Date Noted  . Generalized anxiety disorder 07/02/2015  . Current smoker 08/31/2014  . 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 Fibroids, intramural 08/31/2014  . Menorrhagia with regular cycle 08/12/2014    Past Surgical History:  Procedure Laterality Date  . CARPAL TUNNEL RELEASE Right   . CESAREAN SECTION     x 1  . CHOLECYSTECTOMY    . DILITATION & CURRETTAGE/HYSTROSCOPY WITH NOVASURE ABLATION N/A 10/22/2015   Procedure: DILATATION & CURETTAGE WITH NOVASURE ABLATION;  Surgeon: Emily Filbert, MD;  Location: [facility];  Service: Gynecology;  Laterality: N/A;  . TUBAL LIGATION      Prior  to Admission medications   Medication Sig Start Date End Date Taking? Authorizing Provider  acetaminophen (TYLENOL) 325 MG tablet Take 2 tablets (650 mg total) by mouth every 6 (six) hours as needed. 09/28/17   Langston Masker B, PA-C  citalopram (CELEXA) 20 MG tablet Take 1 tablet (20 mg total) by mouth daily. Patient not taking: Reported on 08/11/2015 08/03/15   Boykin Nearing, MD  ferrous sulfate 325 (65 FE) MG tablet Take 1 tablet (325 mg total) by mouth 2 (two) times daily with a meal. Patient not taking: Reported on 08/11/2015 08/31/14   Boykin Nearing, MD  ibuprofen (ADVIL,MOTRIN) 600 MG tablet Take 1 tablet (600 mg total) by mouth every 6 (six) hours as needed. 09/28/17   Langston Masker B, PA-C  loperamide (IMODIUM A-D) 2 MG capsule Take 2 mg by mouth as needed for diarrhea or loose stools.    [provider]  ondansetron (ZOFRAN ODT) 4 MG disintegrating tablet Take 1 tablet (4 mg total) by mouth every 8 (eight) hours as needed for nausea or vomiting. 12/14/15   Geronimo Boot, MD  ranitidine (ZANTAC) 150 MG tablet Take 150 mg by mouth once as needed for heartburn.    [provider]  zolpidem (AMBIEN) 10 MG tablet TAKE 1 TABLET BY MOUTH AT BEDTIME AS NEEDED FOR SLEEP 06/26/16   Boykin Nearing, MD    Allergies  Allergen Reactions  . Codeine Hives, Itching and Nausea And Vomiting  Family History  Problem Relation Age of Onset  . Diabetes Mother   . Cancer Father        cirrhosis    Social History Social History   Tobacco Use  . Smoking status: Current Every Day Smoker    Packs/day: 0.50    Years: 20.00    Pack years: 10.00    Types: Cigarettes  . Smokeless tobacco: Never Used  Substance Use Topics  . Alcohol use: Yes    Comment: occasionally beer  . Drug use: No    Review of Systems Constitutional: Subjective fever/chills Eyes: Negative for visual complaints ENT: Positive for congestion Cardiovascular: States diffuse chest pain when  coughing Respiratory: Positive for shortness of breath, positive for cough. Gastrointestinal: States mild left flank pain only when coughing.  Denies nausea or vomiting but does state diarrhea  Genitourinary: Occasional urinary incontinence with coughing spell but denies any dysuria. Musculoskeletal: Negative for leg pain or swelling Skin: Negative for skin complaints  Neurological: Negative for headache All other ROS negative  ____________________________________________   PHYSICAL EXAM:  VITAL SIGNS: ED Triage Vitals [12/12/17 1313]  Enc Vitals Group     BP (!) 122/99     Pulse Rate (!) 135     Resp (!) 22     Temp 100 F (37.8 C)     Temp Source Oral     SpO2 97 %     Weight 185 lb (83.9 kg)     Height 5\' 4"  (1.626 m)     Head Circumference      Peak Flow      Pain Score 10     Pain Loc      Pain Edu?      Excl. in [place]?    Constitutional: Alert and oriented.  Tearful, mild distress and anxious appearing Eyes: Normal exam ENT   Head: Normocephalic and atraumatic.   Mouth/Throat: Mucous membranes are moist. Cardiovascular: Regular rhythm rate around 130 bpm.  No obvious murmur. Respiratory: Moderate tachypnea, frequent cough.  Moderate expiratory wheezes bilaterally. Gastrointestinal: Soft and nontender. No distention.   Musculoskeletal: Nontender with normal range of motion in all extremities.  Negative for leg edema or tenderness Neurologic:  Normal speech and language. No gross focal neurologic deficits  Skin:  Skin is warm, dry and intact.  Psychiatric: Mood and affect are normal. Speech and behavior are normal.   ____________________________________________    EKG  EKG reviewed and interpreted by myself shows sinus tachycardia 136 bpm with narrow QRS, normal axis, normal intervals, patient does have lateral ST depressions.  ____________________________________________    RADIOLOGY  CT scan is most consistent with multifocal pneumonia.  Possible  enteritis. Chest x-ray negative ____________________________________________   INITIAL IMPRESSION / ASSESSMENT AND PLAN / ED COURSE  Pertinent labs & imaging results that were available during my care of the patient were reviewed by me and considered in my medical decision making (see chart for details).  Patient presents to the emergency department for chest discomfort frequent cough subjective fever and chills left-sided abdominal pain.  Differential is quite broad but would include pneumonia, pneumothorax, pulmonary embolism, ACS, COPD exacerbation.  We will treat with Solu-Medrol, duo nebs.  We will treat with IV fluids pain and nausea medication.  We will obtain labs including cardiac panel as well as a CT angiography to further evaluate given the patient's tachypnea, tachycardia chest pain frequent cough and lateral ST depressions.  CT scan was consistent multifocal pneumonia.  Patient is now de-satting  at times to 88% on room air.  Continues to have significant chest pain.  We will check blood cultures start on antibiotics and admit to the hospitalist service for further treatment.  Given the patient's tachypnea, tachycardia with infectious source and low-grade fever patient meets sepsis criteria.  She has been admitted to the hospital service for ongoing treatment.  CRITICAL CARE Performed by: Harvest Dark   Total critical care time: 30 minutes  Critical care time was exclusive of separately billable procedures and treating other patients.  Critical care was necessary to treat or prevent imminent or life-threatening deterioration.  Critical care was time spent personally by me on the following activities: development of treatment plan with patient and/or surrogate as well as nursing, discussions with consultants, evaluation of patient's response to treatment, examination of patient, obtaining history from patient or surrogate, ordering and performing treatments and interventions,  ordering and review of laboratory studies, ordering and review of radiographic studies, pulse oximetry and re-evaluation of patient's condition.   ____________________________________________   FINAL CLINICAL IMPRESSION(S) / ED DIAGNOSES  Sepsis Chest pain Cough Multifocal pneumonia   Harvest Dark, MD 12/12/17 1525    Harvest Dark, MD 12/12/17 1529

## 2017-12-12 NOTE — ED Notes (Signed)
MD Siadecki notified that blood cultures were drawn after antibiotic administration

## 2017-12-12 NOTE — ED Triage Notes (Signed)
Pt states she thinks she has pneumonia from son. States symptoms x 2 weeks. Productive cough. Unsure of fever. Crying in triage. Coughing in triage. Talking in complete sentences. States she can't drink because of her breathing. Pt is breathing fast d/t crying. No other resp distress noted.

## 2017-12-13 ENCOUNTER — Other Ambulatory Visit: Payer: Self-pay

## 2017-12-13 LAB — BASIC METABOLIC PANEL
ANION GAP: 9 (ref 5–15)
BUN: 9 mg/dL (ref 6–20)
CHLORIDE: 111 mmol/L (ref 98–111)
CO2: 22 mmol/L (ref 22–32)
CREATININE: 0.9 mg/dL (ref 0.44–1.00)
Calcium: 8.1 mg/dL — ABNORMAL LOW (ref 8.9–10.3)
GFR calc non Af Amer: >60 mL/min (ref >60)
Glucose, Bld: 215 mg/dL — ABNORMAL HIGH (ref 70–99)
POTASSIUM: 3.4 mmol/L — AB (ref 3.5–5.1)
Sodium: 142 mmol/L (ref 135–145)

## 2017-12-13 LAB — CBC
HCT: 39.9 % (ref 35.0–47.0)
HEMOGLOBIN: 13.9 g/dL (ref 12.0–16.0)
MCH: 32.6 pg (ref 26.0–34.0)
MCHC: 34.7 g/dL (ref 32.0–36.0)
MCV: 94.1 fL (ref 80.0–100.0)
PLATELETS: 305 10*3/uL (ref 150–440)
RBC: 4.25 MIL/uL (ref 3.80–5.20)
RDW: 13.1 % (ref 11.5–14.5)
WBC: 8.9 10*3/uL (ref 3.6–11.0)

## 2017-12-13 LAB — EXPECTORATED SPUTUM ASSESSMENT W REFEX TO RESP CULTURE: SPECIAL REQUESTS: NORMAL

## 2017-12-13 LAB — EXPECTORATED SPUTUM ASSESSMENT W GRAM STAIN, RFLX TO RESP C

## 2017-12-13 LAB — HIV ANTIBODY (ROUTINE TESTING W REFLEX): HIV Screen 4th Generation wRfx: NONREACTIVE

## 2017-12-13 MED ORDER — ZOLPIDEM TARTRATE 5 MG PO TABS
5.0000 mg | ORAL_TABLET | Freq: Every evening | ORAL | Status: DC | PRN
  Administered 2017-12-13: 03:00:00 5 mg via ORAL
  Filled 2017-12-13: qty 1

## 2017-12-13 MED ORDER — POTASSIUM CHLORIDE CRYS ER 20 MEQ PO TBCR
40.0000 meq | EXTENDED_RELEASE_TABLET | Freq: Once | ORAL | Status: AC
Start: 2017-12-13 — End: 2017-12-13
  Administered 2017-12-13: 40 meq via ORAL
  Filled 2017-12-13: qty 2

## 2017-12-13 MED ORDER — ALPRAZOLAM 0.5 MG PO TABS
0.5000 mg | ORAL_TABLET | Freq: Two times a day (BID) | ORAL | Status: DC | PRN
  Administered 2017-12-13 – 2017-12-14 (×3): 0.5 mg via ORAL
  Filled 2017-12-13 (×3): qty 1

## 2017-12-13 NOTE — Progress Notes (Signed)
[facility] NAME: Marrissa Dai    MR#:  [id number]  DATE OF BIRTH:  1965/09/04  SUBJECTIVE:  CHIEF COMPLAINT:   Chief Complaint  Patient presents with  . Shortness of Breath   And used to have shortness of breath and pleuritic chest pain.  Nonproductive cough.  Afebrile today.  Needing oxygen intermittently.  Anxious.  REVIEW OF SYSTEMS:    Review of Systems  Constitutional: Positive for malaise/fatigue. Negative for chills and fever.  HENT: Negative for sore throat.   Eyes: Negative for blurred vision, double vision and pain.  Respiratory: Positive for cough, shortness of breath and wheezing. Negative for hemoptysis.   Cardiovascular: Positive for chest pain. Negative for palpitations, orthopnea and leg swelling.  Gastrointestinal: Negative for abdominal pain, constipation, diarrhea, heartburn, nausea and vomiting.  Genitourinary: Negative for dysuria and hematuria.  Musculoskeletal: Negative for back pain and joint pain.  Skin: Negative for rash.  Neurological: Negative for sensory change, speech change, focal weakness and headaches.  Endo/Heme/Allergies: Does not bruise/bleed easily.  Psychiatric/Behavioral: Negative for depression. The patient is nervous/anxious.     DRUG ALLERGIES:   Allergies  Allergen Reactions  . Codeine Hives, Itching and Nausea And Vomiting    VITALS:  Blood pressure 130/70, pulse 79, temperature (!) 97.5 F (36.4 C), temperature source Oral, resp. rate 19, height 5\' 4"  (1.626 m), weight 83.9 kg (185 lb), SpO2 97 %.  PHYSICAL EXAMINATION:   Physical Exam  GENERAL:  52 y.o.-year-old patient lying in the bed with conversational dyspnea. EYES: Pupils equal, round, reactive to light and accommodation. No scleral icterus. Extraocular muscles intact.  HEENT: Head atraumatic, normocephalic. Oropharynx and nasopharynx clear.  NECK:  Supple, no jugular venous distention. No thyroid enlargement, no  tenderness.  LUNGS: Bilateral wheezing.  Increased work of breathing CARDIOVASCULAR: S1, S2 normal. No murmurs, rubs, or gallops.  ABDOMEN: Soft, nontender, nondistended. Bowel sounds present. No organomegaly or mass.  EXTREMITIES: No cyanosis, clubbing or edema b/l.    NEUROLOGIC: Cranial nerves II through XII are intact. No focal Motor or sensory deficits b/l.   PSYCHIATRIC: The patient is alert and oriented x 3.  Anxious SKIN: No obvious rash, lesion, or ulcer.   LABORATORY PANEL:   CBC Recent Labs  Lab 12/13/17 0427  WBC 8.9  HGB 13.9  HCT 39.9  PLT 305   ------------------------------------------------------------------------------------------------------------------ Chemistries  Recent Labs  Lab 12/13/17 0427  NA 142  K 3.4*  CL 111  CO2 22  GLUCOSE 215*  BUN 9  CREATININE 0.90  CALCIUM 8.1*   ------------------------------------------------------------------------------------------------------------------  Cardiac Enzymes Recent Labs  Lab 12/12/17 1315  TROPONINI <0.03   ------------------------------------------------------------------------------------------------------------------  RADIOLOGY:  Dg Chest 2 View  Result Date: 12/12/2017 CLINICAL DATA:  Productive cough and fever EXAM: CHEST - 2 VIEW COMPARISON:  06/18/2014 FINDINGS: Cardiac shadow is stable. The lungs are well aerated bilaterally. No focal infiltrate or sizable effusion is seen. No acute bony abnormality is noted. IMPRESSION: No active cardiopulmonary disease. Electronically Signed   By: Inez Catalina M.D.   On: 12/12/2017 14:02   Ct Angio Chest Pe W And/or Wo Contrast  Result Date: 12/12/2017 CLINICAL DATA:  Productive cough. Left lower quadrant abdominal pain. EXAM: CT ANGIOGRAPHY CHEST CT ABDOMEN AND PELVIS WITH CONTRAST TECHNIQUE: Multidetector CT imaging of the chest was performed using the standard protocol during bolus administration of intravenous contrast. Multiplanar CT image  reconstructions and MIPs were obtained to evaluate the vascular anatomy. Multidetector CT  imaging of the abdomen and pelvis was performed using the standard protocol during bolus administration of intravenous contrast. CONTRAST:  176mL ISOVUE-370 IOPAMIDOL (ISOVUE-370) INJECTION 76% COMPARISON:  Chest x-ray dated 12/12/2017 FINDINGS: CTA CHEST FINDINGS Cardiovascular: Satisfactory opacification of the pulmonary arteries to the segmental level. No evidence of pulmonary embolism. Normal heart size. No pericardial effusion. Mediastinum/Nodes: Multiple small bilateral hilar and mediastinal lymph nodes. Trachea, thyroid gland, and esophagus appear normal. Lungs/Pleura: Extensive bilateral peribronchial thickening with small patchy areas of infiltrate in the right upper and lower lobes and in the left upper and lower lobes. No effusions. Musculoskeletal: No chest wall abnormality. No acute or significant osseous findings. Review of the MIP images confirms the above findings. CT ABDOMEN and PELVIS FINDINGS Hepatobiliary: No focal liver abnormality is seen. Status post cholecystectomy. No biliary dilatation. Pancreas: Unremarkable. No pancreatic ductal dilatation or surrounding inflammatory changes. Spleen: Normal in size without focal abnormality. Adrenals/Urinary Tract: Normal adrenal glands. 19 mm simple cyst on the upper pole of the left kidney. Kidneys are otherwise normal. No hydronephrosis. Normal bladder. Stomach/Bowel: There multiple slightly distended loops of small bowel without a discrete transition point. Colon appears normal. Terminal ileum and appendix are normal. 8 mm density in the cecum adjacent to the base of the appendix is probably ingested tablet rather than an appendicolith. Vascular/Lymphatic: No significant vascular findings are present. No enlarged abdominal or pelvic lymph nodes. Reproductive: 22 mm cyst on the right ovary.  Otherwise normal. Other: No abdominal wall hernia or abnormality. No  abdominopelvic ascites. Musculoskeletal: No acute or significant osseous findings. Review of the MIP images confirms the above findings. IMPRESSION: 1. Multiple small patchy areas of infiltrate in both lungs. 2. Slight nonspecific dilatation of multiple small bowel loops without a discrete transition point. This could represent enteritis. Electronically Signed   By: Lorriane Shire M.D.   On: 12/12/2017 15:20   Ct Abdomen Pelvis W Contrast  Result Date: 12/12/2017 CLINICAL DATA:  Productive cough. Left lower quadrant abdominal pain. EXAM: CT ANGIOGRAPHY CHEST CT ABDOMEN AND PELVIS WITH CONTRAST TECHNIQUE: Multidetector CT imaging of the chest was performed using the standard protocol during bolus administration of intravenous contrast. Multiplanar CT image reconstructions and MIPs were obtained to evaluate the vascular anatomy. Multidetector CT imaging of the abdomen and pelvis was performed using the standard protocol during bolus administration of intravenous contrast. CONTRAST:  140mL ISOVUE-370 IOPAMIDOL (ISOVUE-370) INJECTION 76% COMPARISON:  Chest x-ray dated 12/12/2017 FINDINGS: CTA CHEST FINDINGS Cardiovascular: Satisfactory opacification of the pulmonary arteries to the segmental level. No evidence of pulmonary embolism. Normal heart size. No pericardial effusion. Mediastinum/Nodes: Multiple small bilateral hilar and mediastinal lymph nodes. Trachea, thyroid gland, and esophagus appear normal. Lungs/Pleura: Extensive bilateral peribronchial thickening with small patchy areas of infiltrate in the right upper and lower lobes and in the left upper and lower lobes. No effusions. Musculoskeletal: No chest wall abnormality. No acute or significant osseous findings. Review of the MIP images confirms the above findings. CT ABDOMEN and PELVIS FINDINGS Hepatobiliary: No focal liver abnormality is seen. Status post cholecystectomy. No biliary dilatation. Pancreas: Unremarkable. No pancreatic ductal dilatation or  surrounding inflammatory changes. Spleen: Normal in size without focal abnormality. Adrenals/Urinary Tract: Normal adrenal glands. 19 mm simple cyst on the upper pole of the left kidney. Kidneys are otherwise normal. No hydronephrosis. Normal bladder. Stomach/Bowel: There multiple slightly distended loops of small bowel without a discrete transition point. Colon appears normal. Terminal ileum and appendix are normal. 8 mm density in the cecum adjacent to  the base of the appendix is probably ingested tablet rather than an appendicolith. Vascular/Lymphatic: No significant vascular findings are present. No enlarged abdominal or pelvic lymph nodes. Reproductive: 22 mm cyst on the right ovary.  Otherwise normal. Other: No abdominal wall hernia or abnormality. No abdominopelvic ascites. Musculoskeletal: No acute or significant osseous findings. Review of the MIP images confirms the above findings. IMPRESSION: 1. Multiple small patchy areas of infiltrate in both lungs. 2. Slight nonspecific dilatation of multiple small bowel loops without a discrete transition point. This could represent enteritis. Electronically Signed   By: Lorriane Shire M.D.   On: 12/12/2017 15:20     ASSESSMENT AND PLAN:   *Bilateral pneumonia *Acute hypoxic respiratory failure - resolved *Sepsis resolved *Tobacco abuse  Patient on IV antibiotics which need to be continued for 1 more day inpatient.  Continues to have wheezing but improved.  IV steroids and nebulizers.  Add Xanax for anxiety as needed. Cultures negative.  Likely discharge home tomorrow on oral antibiotics,  prednisone and inhalers.  All the records are reviewed and case discussed with Care Management/Social Worker Management plans discussed with the patient, family and they are in agreement.  CODE STATUS: FULL CODE  DVT Prophylaxis: SCDs  TOTAL TIME TAKING CARE OF THIS PATIENT: 35 minutes.   POSSIBLE D/C IN 1-2 DAYS, DEPENDING ON CLINICAL CONDITION.  Leia Alf  Eyvette Cordon M.D on 12/13/2017 at 10:39 AM  Between 7am to 6pm - Pager - [phone]  After 6pm go to [email or web address] - password [email or web address] [facility]  Office  [phone]  CC: Primary care physician; Patient, No Pcp Per  Note: This dictation was prepared with Dragon dictation along with smaller phrase technology. Any transcriptional errors that result from this process are unintentional.

## 2017-12-13 NOTE — Clinical Social Work Note (Signed)
CSW received a consult for "medication needs". This consult needs to be placed for RN case manager. CSW signing off. Please re consult if further needs arise.   [place], [place]

## 2017-12-13 NOTE — Progress Notes (Signed)
Inpatient Diabetes Program Recommendations  AACE/ADA: New Consensus Statement on Inpatient Glycemic Control (2019)  Target Ranges:  Prepandial:   less than 140 mg/dL      Peak postprandial:   less than 180 mg/dL (1-2 hours)      Critically ill patients:  140 - 180 mg/dL   Results for MAJESTI, GAMBRELL (MRN [id number]) as of 12/13/2017 12:33  Ref. Range 12/12/2017 13:15 12/13/2017 04:27  Glucose Latest Ref Range: 70 - 99 mg/dL 155 (H) 215 (H)   Review of Glycemic Control  Diabetes history: No Outpatient Diabetes medications: NA Current orders for Inpatient glycemic control: None  Inpatient Diabetes Program Recommendations: Correction (SSI): While inpatient and ordered steroids, please consider ordering CBGs with Novolog correction scale ACHS.  Thanks, Barnie Alderman, RN, MSN, CDE Diabetes Coordinator Inpatient Diabetes Program [phone] (Team Pager from 8am to 5pm)

## 2017-12-14 MED ORDER — GUAIFENESIN-DM 100-10 MG/5ML PO SYRP: 5.0000 mL | ORAL_SOLUTION | ORAL | 0 refills | Status: DC | PRN

## 2017-12-14 MED ORDER — PREDNISONE 10 MG (21) PO TBPK: ORAL_TABLET | ORAL | 0 refills | Status: DC

## 2017-12-14 MED ORDER — LEVOFLOXACIN 500 MG PO TABS: 500.0000 mg | ORAL_TABLET | Freq: Every day | ORAL | 0 refills | Status: DC

## 2017-12-14 MED ORDER — FLUTICASONE-SALMETEROL 100-50 MCG/DOSE IN AEPB: 1.0000 | INHALATION_SPRAY | Freq: Two times a day (BID) | RESPIRATORY_TRACT | 0 refills | Status: DC

## 2017-12-14 MED ORDER — ALBUTEROL SULFATE HFA 108 (90 BASE) MCG/ACT IN AERS: 2.0000 | INHALATION_SPRAY | Freq: Four times a day (QID) | RESPIRATORY_TRACT | 0 refills | Status: DC | PRN

## 2017-12-14 MED ORDER — ALPRAZOLAM 0.5 MG PO TABS: 0.5000 mg | ORAL_TABLET | Freq: Every day | ORAL | 0 refills | Status: DC | PRN

## 2017-12-14 NOTE — Discharge Instructions (Signed)
Advance activity slowly as tolerated

## 2017-12-14 NOTE — Care Management Note (Signed)
Case Management Note  Patient Details  Name: CHAILYN RACETTE MRN: [id number] Date of Birth: 1965/07/25  Subjective/Objective:     Admitted to [facility] with the diagnosis of pneumonia. No primary care physician. No insurance listed. Lives in [place].  States she has been to to [facility] in [place] Discussed that she would need to go back to the [facility] in [place] since she was a [facility] resident.               Action/Plan: Received referral for help with medications. Will need to go to the [facility] in [place]  Expected Discharge Date:  12/14/17               Expected Discharge Plan:     In-House Referral:    yes Discharge planning Services     Post Acute Care Choice:    Choice offered to:     DME Arranged:    DME Agency:     HH Arranged:    HH Agency:     Status of Service:     If discussed at [employer], dates discussed:    Additional Comments:  Shelbie Ammons, RN MSN CCM Care Management [phone] 12/14/2017, 3:00 PM

## 2017-12-15 LAB — CULTURE, RESPIRATORY W GRAM STAIN
Culture: NORMAL
Special Requests: NORMAL

## 2017-12-17 LAB — CULTURE, BLOOD (ROUTINE X 2)
Culture: NO GROWTH
Special Requests: ADEQUATE

## 2017-12-19 ENCOUNTER — Telehealth: Payer: Self-pay

## 2017-12-19 NOTE — Telephone Encounter (Signed)
EMMI Follow-up: Ms. Terri Hogan called and said she had just received a call from my number.  I explained our process of 2 automated calls post discharge.  She was waiting on a call from DSS to see if she would be eligible for Medicaid due to health issues and has a need for oxygen.  She is going to also reapply for disability.  She does not have a PCP so the number to the [facility] Physician Referral Service was given. Wasn't sure if pulmonologist would schedule an appointment since she has no insurance.  Advised her to let MD office know she was working with DSS to see if she would be eligible for Medicaid and to see of they would accept payments. I let her know there would be another automated call with a different series of questions and to let us know if she had any other concerns at that time.

## 2017-12-21 NOTE — Discharge Summary (Signed)
[facility] NAME: Terri Hogan    MR#:  [id number]  DATE OF BIRTH:  1965/09/07  DATE OF ADMISSION:  12/12/2017 ADMITTING PHYSICIAN: Hillary Bow, MD  DATE OF DISCHARGE: 12/14/2017  3:31 PM  PRIMARY CARE PHYSICIAN: Patient, No Pcp Per   ADMISSION DIAGNOSIS:  Sepsis, due to unspecified organism ([place]) [A41.9] Community acquired pneumonia, unspecified laterality [J18.9]  DISCHARGE DIAGNOSIS:  Active Problems:   Pneumonia  SECONDARY DIAGNOSIS:   Past Medical History:  Diagnosis Date  . Anemia   . GERD (gastroesophageal reflux disease)    otc prn  . Insomnia   . Menorrhagia   . PONV (postoperative nausea and vomiting)   . SVD (spontaneous vaginal delivery)    x 2     ADMITTING HISTORY  HISTORY OF PRESENT ILLNESS:  Terri Hogan  is a 52 y.o. female with a known history of tobacco use, recurrent bronchitis episodes presents to the hospital complaining of 2 weeks of shortness of breath, coughing, chest congestion.  She initially had clear sputum which has turned green.  No blood.  She also complains of pleuritic chest pain.  Oxygen saturations have been dipping into the 80s at times.  Patient has been found to be tachycardic, tachypnea, sepsis present on admission.  CT scan of the chest showed no PE but bilateral pneumonia.  She has had exposure to her son at home who also had pneumonia recently.  Patient continues to smoke. No diagnosed COPD but has bronchitis once or twice in the year.  HOSPITAL COURSE:   *Bilateral pneumonia *Acute hypoxic respiratory failure - resolved *Sepsis resolved *Tobacco abuse  Patient was admitted to medical floor.  Started on IV Levaquin.  IV Solu-Medrol and nebulizers.  She improved well and by the day of discharge she has no fever.  Not needing oxygen.  Very minimal wheezing.  Patient's primary problem seems to be anxiety causing her shortness of breath.  Started on Xanax and prescription given.   Patient discharged home on oral antibiotics, prednisone taper and inhalers.  Counseled extensively to quit smoking.  Stable for discharge home and follow-up with primary care physician.  CONSULTS OBTAINED:  Treatment Team:  Hillary Bow, MD  DRUG ALLERGIES:   Allergies  Allergen Reactions  . Codeine Hives, Itching and Nausea And Vomiting    DISCHARGE MEDICATIONS:   Allergies as of 12/14/2017      Reactions   Codeine Hives, Itching, Nausea And Vomiting      Medication List    TAKE these medications   acetaminophen 325 MG tablet Commonly known as:  TYLENOL Take 2 tablets (650 mg total) by mouth every 6 (six) hours as needed.   albuterol 108 (90 Base) MCG/ACT inhaler Commonly known as:  PROVENTIL HFA;VENTOLIN HFA Inhale 2 puffs into the lungs every 6 (six) hours as needed for wheezing or shortness of breath.   ALPRAZolam 0.5 MG tablet Commonly known as:  XANAX Take 1 tablet (0.5 mg total) by mouth daily as needed for anxiety.   Fluticasone-Salmeterol 100-50 MCG/DOSE Aepb Commonly known as:  ADVAIR DISKUS Inhale 1 puff into the lungs 2 (two) times daily.   guaiFENesin-dextromethorphan 100-10 MG/5ML syrup Commonly known as:  ROBITUSSIN DM Take 5 mLs by mouth every 4 (four) hours as needed for cough.   ibuprofen 600 MG tablet Commonly known as:  ADVIL,MOTRIN Take 1 tablet (600 mg total) by mouth every 6 (six) hours as needed.   IMODIUM A-D 2 MG capsule Generic  drug:  loperamide Take 2 mg by mouth as needed for diarrhea or loose stools.   levofloxacin 500 MG tablet Commonly known as:  LEVAQUIN Take 1 tablet (500 mg total) by mouth daily.   predniSONE 10 MG (21) Tbpk tablet Commonly known as:  STERAPRED UNI-PAK 21 TAB 6 tabs day 1 and taper 10 mg a day - 6 days   ranitidine 150 MG tablet Commonly known as:  ZANTAC Take 150 mg by mouth once as needed for heartburn.       Today   VITAL SIGNS:  Blood pressure (!) 141/78, pulse 75, temperature (!) 97.5 F  (36.4 C), temperature source Oral, resp. rate 18, height 5\' 4"  (1.626 m), weight 83.9 kg (185 lb), SpO2 95 %.  I/O:  No intake or output data in the 24 hours ending 12/21/17 1448  PHYSICAL EXAMINATION:  Physical Exam  GENERAL:  52 y.o.-year-old patient lying in the bed with no acute distress.  LUNGS: Normal breath sounds bilaterally, no wheezing, rales,rhonchi or crepitation. No use of accessory muscles of respiration.  CARDIOVASCULAR: S1, S2 normal. No murmurs, rubs, or gallops.  ABDOMEN: Soft, non-tender, non-distended. Bowel sounds present. No organomegaly or mass.  NEUROLOGIC: Moves all 4 extremities. PSYCHIATRIC: The patient is alert and oriented x 3.  SKIN: No obvious rash, lesion, or ulcer.   DATA REVIEW:   CBC No results for input(s): WBC, HGB, HCT, PLT in the last 168 hours.  Chemistries  No results for input(s): NA, K, CL, CO2, GLUCOSE, BUN, CREATININE, CALCIUM, MG, AST, ALT, ALKPHOS, BILITOT in the last 168 hours.  Invalid input(s): GFRCGP  Cardiac Enzymes No results for input(s): TROPONINI in the last 168 hours.  Microbiology Results  Results for orders placed or performed during the hospital encounter of 12/12/17  Blood culture (routine x 2)     Status: None   Collection Time: 12/12/17  5:25 PM  Result Value Ref Range Status   Specimen Description BLOOD LEFT HAND  Final   Special Requests   Final    BOTTLES DRAWN AEROBIC AND ANAEROBIC Blood Culture adequate volume   Culture   Final    NO GROWTH 5 DAYS Performed at [facility], [address] [place], [place] [address]    Report Status 12/17/2017 FINAL  Final  Culture, sputum-assessment     Status: None   Collection Time: 12/12/17  5:25 PM  Result Value Ref Range Status   Specimen Description   Final    SPUTUM Performed at [facility], [address], [place], [place] [address]    Special Requests   Final    Normal Performed at [facility], [address]  [place], [place] [address]    Sputum evaluation   Final    THIS SPECIMEN IS ACCEPTABLE FOR SPUTUM CULTURE Performed at [facility], [place] [address], [place], [place] [address]    Report Status 12/13/2017 FINAL  Final  Culture, respiratory     Status: None   Collection Time: 12/12/17  5:25 PM  Result Value Ref Range Status   Specimen Description SPUTUM  Final   Special Requests Normal Reflexed from [id number]  Final   Gram Stain   Final    MODERATE WBC PRESENT, PREDOMINANTLY PMN MODERATE GRAM POSITIVE COCCI RARE GRAM NEGATIVE RODS RARE GRAM POSITIVE RODS    Culture   Final    Consistent with normal respiratory flora. Performed at [facility], [place] [address] [place], [place] [address]  Report Status 12/15/2017 FINAL  Final    RADIOLOGY:  No results found.  Follow up with PCP in 1 week.  Management plans discussed with the patient, family and they are in agreement.  CODE STATUS:  Code Status History    Date Active Date Inactive Code Status Order ID Comments User Context   12/12/2017 1603 12/14/2017 2006 Full Code [id number]  Hillary Bow, MD ED      TOTAL TIME TAKING CARE OF THIS PATIENT ON DAY OF DISCHARGE: more than 30 minutes.   Leia Alf Nashon Erbes M.D on 12/21/2017 at 2:48 PM  Between 7am to 6pm - Pager - [phone]  After 6pm go to [email or web address] - password [email or web address] [facility]  Office  [phone]  CC: Primary care physician; Patient, No Pcp Per  Note: This dictation was prepared with Dragon dictation along with smaller phrase technology. Any transcriptional errors that result from this process are unintentional.

## 2017-12-31 MED FILL — !ADVAIR 100/50 DISKUS: 100-50 | 30 days supply | Qty: 60 | Fill #0

## 2018-01-04 ENCOUNTER — Emergency Department (HOSPITAL_COMMUNITY)
Admission: EM | Admit: 2018-01-04 | Discharge: 2018-01-04 | Disposition: A | Discharge status: Home / Self Care | Payer: Self-pay | Attending: Emergency Medicine | Admitting: Emergency Medicine

## 2018-01-04 ENCOUNTER — Emergency Department (HOSPITAL_COMMUNITY): Payer: Self-pay

## 2018-01-04 ENCOUNTER — Encounter (HOSPITAL_COMMUNITY): Payer: Self-pay

## 2018-01-04 DIAGNOSIS — F17210 Nicotine dependence, cigarettes, uncomplicated: Secondary | ICD-10-CM | POA: Insufficient documentation

## 2018-01-04 DIAGNOSIS — S0083XA Contusion of other part of head, initial encounter: Secondary | ICD-10-CM

## 2018-01-04 DIAGNOSIS — Y939 Activity, unspecified: Secondary | ICD-10-CM | POA: Insufficient documentation

## 2018-01-04 DIAGNOSIS — Y999 Unspecified external cause status: Secondary | ICD-10-CM | POA: Insufficient documentation

## 2018-01-04 DIAGNOSIS — Y929 Unspecified place or not applicable: Secondary | ICD-10-CM | POA: Insufficient documentation

## 2018-01-04 DIAGNOSIS — Z79899 Other long term (current) drug therapy: Secondary | ICD-10-CM | POA: Insufficient documentation

## 2018-01-04 DIAGNOSIS — S0990XA Unspecified injury of head, initial encounter: Secondary | ICD-10-CM | POA: Insufficient documentation

## 2018-01-04 DIAGNOSIS — S5001XA Contusion of right elbow, initial encounter: Secondary | ICD-10-CM | POA: Insufficient documentation

## 2018-01-04 MED ORDER — IBUPROFEN 600 MG PO TABS: 600.0000 mg | ORAL_TABLET | Freq: Three times a day (TID) | ORAL | 0 refills | Status: DC | PRN

## 2018-01-04 MED ORDER — MORPHINE SULFATE (PF) 4 MG/ML IV SOLN
6.0000 mg | Freq: Once | INTRAVENOUS | Status: AC
  Administered 2018-01-04: 6 mg via INTRAVENOUS
  Filled 2018-01-04: qty 2
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
## 2018-01-04 NOTE — ED Notes (Signed)
Ice pack given

## 2018-01-04 NOTE — ED Notes (Signed)
Refused to sign d/c

## 2018-01-04 NOTE — ED Triage Notes (Signed)
Pt complains of shortness of breath and states she was just in the hospital for pneumonia and COPD, pt satting 89 on room air, pt placed on 2 liters O2

## 2018-01-04 NOTE — ED Triage Notes (Signed)
Pt complains right arm pain and pain under her right eye
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
## 2018-01-07 NOTE — Progress Notes (Deleted)
Patient is a 52 year old female, she was admitted to the hospital in July 2019 for pneumonia.  **Chest x-ray 12/12/2017, CT chest 7/24/201>> imaging personally reviewed,9 patchy bilateral areas of infiltrate, particularly in an area in the posterior right lower lobe.

## 2018-01-09 ENCOUNTER — Inpatient Hospital Stay: Payer: Self-pay | Admitting: Internal Medicine
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
## 2018-01-10 ENCOUNTER — Inpatient Hospital Stay: Payer: Self-pay

## 2018-01-10 ENCOUNTER — Encounter: Payer: Self-pay | Admitting: Internal Medicine

## 2018-03-06 ENCOUNTER — Encounter: Payer: Self-pay | Admitting: Nurse Practitioner

## 2018-03-06 ENCOUNTER — Ambulatory Visit: Payer: Self-pay | Attending: Nurse Practitioner | Admitting: Nurse Practitioner

## 2018-03-06 VITALS — BP 164/104 | HR 85 | Temp 98.0°F | Ht 64.0 in | Wt 194.0 lb

## 2018-03-06 DIAGNOSIS — Z79899 Other long term (current) drug therapy: Secondary | ICD-10-CM | POA: Insufficient documentation

## 2018-03-06 DIAGNOSIS — G4700 Insomnia, unspecified: Secondary | ICD-10-CM

## 2018-03-06 DIAGNOSIS — Z7951 Long term (current) use of inhaled steroids: Secondary | ICD-10-CM | POA: Insufficient documentation

## 2018-03-06 DIAGNOSIS — F17200 Nicotine dependence, unspecified, uncomplicated: Secondary | ICD-10-CM

## 2018-03-06 DIAGNOSIS — R7303 Prediabetes: Secondary | ICD-10-CM

## 2018-03-06 DIAGNOSIS — Z885 Allergy status to narcotic agent status: Secondary | ICD-10-CM | POA: Insufficient documentation

## 2018-03-06 DIAGNOSIS — J410 Simple chronic bronchitis: Secondary | ICD-10-CM

## 2018-03-06 DIAGNOSIS — Z9889 Other specified postprocedural states: Secondary | ICD-10-CM | POA: Insufficient documentation

## 2018-03-06 DIAGNOSIS — Z7952 Long term (current) use of systemic steroids: Secondary | ICD-10-CM | POA: Insufficient documentation

## 2018-03-06 DIAGNOSIS — R232 Flushing: Secondary | ICD-10-CM

## 2018-03-06 DIAGNOSIS — Z9049 Acquired absence of other specified parts of digestive tract: Secondary | ICD-10-CM | POA: Insufficient documentation

## 2018-03-06 DIAGNOSIS — I10 Essential (primary) hypertension: Secondary | ICD-10-CM

## 2018-03-06 DIAGNOSIS — N939 Abnormal uterine and vaginal bleeding, unspecified: Secondary | ICD-10-CM | POA: Insufficient documentation

## 2018-03-06 DIAGNOSIS — F17210 Nicotine dependence, cigarettes, uncomplicated: Secondary | ICD-10-CM | POA: Insufficient documentation

## 2018-03-06 DIAGNOSIS — Z833 Family history of diabetes mellitus: Secondary | ICD-10-CM | POA: Insufficient documentation

## 2018-03-06 DIAGNOSIS — Z791 Long term (current) use of non-steroidal anti-inflammatories (NSAID): Secondary | ICD-10-CM | POA: Insufficient documentation

## 2018-03-06 LAB — POCT GLYCOSYLATED HEMOGLOBIN (HGB A1C): Hemoglobin A1C: 5.2 % (ref 4.0–5.6)

## 2018-03-06 LAB — GLUCOSE, POCT (MANUAL RESULT ENTRY): POC Glucose: 146 mg/dl — AB (ref 70–99)

## 2018-03-06 MED ORDER — PAROXETINE HCL 10 MG PO TABS: 10.0000 mg | ORAL_TABLET | Freq: Every day | ORAL | 1 refills | Status: DC

## 2018-03-06 MED ORDER — FLUTICASONE-SALMETEROL 100-50 MCG/DOSE IN AEPB: 1.0000 | INHALATION_SPRAY | Freq: Two times a day (BID) | RESPIRATORY_TRACT | 1 refills | Status: DC

## 2018-03-06 MED ORDER — NICOTINE 7 MG/24HR TD PT24: 7.0000 mg | MEDICATED_PATCH | Freq: Every day | TRANSDERMAL | 0 refills | Status: AC

## 2018-03-06 MED ORDER — ALBUTEROL SULFATE HFA 108 (90 BASE) MCG/ACT IN AERS: 2.0000 | INHALATION_SPRAY | Freq: Four times a day (QID) | RESPIRATORY_TRACT | 0 refills | Status: DC | PRN

## 2018-03-06 MED ORDER — HYDROCHLOROTHIAZIDE 25 MG PO TABS: 25.0000 mg | ORAL_TABLET | Freq: Every day | ORAL | 3 refills | Status: DC

## 2018-03-06 MED ORDER — CLONIDINE HCL 0.1 MG PO TABS
0.2000 mg | ORAL_TABLET | Freq: Once | ORAL | Status: AC
  Administered 2018-03-06: 0.2 mg via ORAL

## 2018-03-06 MED ORDER — NICOTINE 14 MG/24HR TD PT24: 14.0000 mg | MEDICATED_PATCH | Freq: Every day | TRANSDERMAL | 0 refills | Status: DC

## 2018-03-06 MED ORDER — TRAZODONE HCL 50 MG PO TABS: 50.0000 mg | ORAL_TABLET | Freq: Every day | ORAL | 0 refills | Status: DC

## 2018-03-06 MED FILL — HYDROCHLOROTHIAZIDE 25 MG T: 25 | 30 days supply | Qty: 30 | Fill #0

## 2018-03-06 MED FILL — PARoxetine HCL 10 MG TABS: 10 | 30 days supply | Qty: 30 | Fill #0

## 2018-03-06 MED FILL — FLUTICASONE-SALMETEROL 100-: 100-50 | 30 days supply | Qty: 60 | Fill #0

## 2018-03-06 MED FILL — ALBUTEROL SULFATE HFA 108 (: 108 (90 BAS | 18 days supply | Qty: 18 | Fill #0

## 2018-03-06 MED FILL — NICOTINE 7 MG/24HR PATCH: 7 | 28 days supply | Qty: 28 | Fill #0

## 2018-03-06 MED FILL — NICOTINE 14 MG/24HR PATCH: 14 | 28 days supply | Qty: 28 | Fill #0

## 2018-03-06 NOTE — Progress Notes (Signed)
Assessment & Plan:  Terri Hogan was seen today for establish care.  Diagnoses and all orders for this visit:  Essential hypertension -     cloNIDine (CATAPRES) tablet 0.2 mg -     CMP14+EGFR -     hydrochlorothiazide (HYDRODIURIL) 25 MG tablet; Take 1 tablet (25 mg total) by mouth daily. Continue all antihypertensives as prescribed.  Remember to bring in your blood pressure log with you for your follow up appointment.  DASH/Mediterranean Diets are healthier choices for HTN.    Insomnia, unspecified type -     traZODone (DESYREL) 50 MG tablet; Take 1-2 tablets (50-100 mg total) by mouth at bedtime.  Prediabetes -     Glucose (CBG) -     HgB A1c  Simple chronic bronchitis (HCC) -     Fluticasone-Salmeterol (ADVAIR DISKUS) 100-50 MCG/DOSE AEPB; Inhale 1 puff into the lungs 2 (two) times daily. -     albuterol (PROVENTIL HFA;VENTOLIN HFA) 108 (90 Base) MCG/ACT inhaler; Inhale 2 puffs into the lungs every 6 (six) hours as needed for wheezing or shortness of breath.  Tobacco dependence -     nicotine (NICODERM CQ - DOSED IN MG/24 HOURS) 14 mg/24hr patch; Place 1 patch (14 mg total) onto the skin daily. -     nicotine (NICODERM CQ - DOSED IN MG/24 HR) 7 mg/24hr patch; Place 1 patch (7 mg total) onto the skin daily for 28 days.  Hot flashes -     PARoxetine (PAXIL) 10 MG tablet; Take 1 tablet (10 mg total) by mouth daily.    Patient has been counseled on age-appropriate routine health concerns for screening and prevention. These are reviewed and up-to-date. Referrals have been placed accordingly. Immunizations are up-to-date or declined.    Subjective:   Chief Complaint  Patient presents with  . Establish Care    Pt. stated she is having vaginal bleeding and would like to be presribe something for her nerve, and nicotine patch.    HPI Terri Hogan 52 y.o. female presents to office today to establish care.  She denies any previous history of HTN however blood pressure is not well  controlled today. She does continue to smoke 1/2 ppd of cigarettes. She is requesting ambien for sleep. I have instructed her that I do not prescribed ambien however I would be glad to start her on a trial of trazodone. She is agreeable.  HTN Will start on  HCTZ 25 mg today and follow back up in a few weeks for effectiveness. Denies chest pain, shortness of breath, palpitations, lightheadedness, dizziness, headaches or BLE edema. She is a smoker. Wants to quit. Willing to try nicotine patches today.  BP Readings from Last 3 Encounters:  03/06/18 (!) 164/104  01/04/18 (!) 127/91  12/14/17 (!) 141/78   Prediabetes Controlled. Denies any hypo or hyperglycemic symptoms.  Lab Results  Component Value Date   HGBA1C 5.2 03/06/2018   Hot Flashes Patient presents to discuss hormone replacement therapy. Patient is requesting hormone replacement therapy due to hot flashes. The patient is not currently taking hormone replacement therapy.  Patient denies post-menopausal vaginal bleeding. Cyclic bleeding is reported: she is s/p uterine ablation 2/2 menorrhagia with uterine fibroids however continues with menstrual bleeding monthly. Will try Paxil to help improve hot flashes as well as reported anxiety. GAD 7 : Generalized Anxiety Score 03/06/2018 07/02/2015  Nervous, Anxious, on Edge 3 1  Control/stop worrying 3 1  Worry too much - different things 3 1  Trouble relaxing  3 1  Restless 3 1  Easily annoyed or irritable 3 1  Afraid - awful might happen 2 0  Total GAD 7 Score 20 6    Chronic Bronchitis Diagnosed with CAP during a 2 day hospital admission on 12-12-2017. She has a history of recurrent bronchitis with 2-3 episodes per year per her report. She was started on advair during that hospital admission and reports advair provides significant relief of her coughing, wheezing and shortness of breath. Will reorder today.    Review of Systems  Constitutional: Negative for fever, malaise/fatigue and  weight loss.       SEE HPI  HENT: Negative.  Negative for nosebleeds.   Eyes: Negative.  Negative for blurred vision, double vision and photophobia.  Respiratory: Positive for cough. Negative for sputum production and shortness of breath.   Cardiovascular: Negative.  Negative for chest pain, palpitations and leg swelling.  Gastrointestinal: Positive for heartburn. Negative for nausea and vomiting.  Genitourinary:       SEE HPI  Musculoskeletal: Negative.  Negative for myalgias.  Neurological: Negative.  Negative for dizziness, focal weakness, seizures and headaches.  Psychiatric/Behavioral: Negative for suicidal ideas. The patient is nervous/anxious and has insomnia.     Past Medical History:  Diagnosis Date  . Anemia   . GERD (gastroesophageal reflux disease)    otc prn  . Insomnia   . Menorrhagia   . PONV (postoperative nausea and vomiting)   . SVD (spontaneous vaginal delivery)    x 2    Past Surgical History:  Procedure Laterality Date  . CARPAL TUNNEL RELEASE Right   . CESAREAN SECTION     x 1  . CHOLECYSTECTOMY    . DILITATION & CURRETTAGE/HYSTROSCOPY WITH NOVASURE ABLATION N/A 10/22/2015   Procedure: DILATATION & CURETTAGE WITH NOVASURE ABLATION;  Surgeon: Emily Filbert, MD;  Location: [facility];  Service: Gynecology;  Laterality: N/A;  . TUBAL LIGATION      Family History  Problem Relation Age of Onset  . Diabetes Mother   . Cancer Father        cirrhosis    Social History Reviewed with no changes to be made today.   Outpatient Medications Prior to Visit  Medication Sig Dispense Refill  . Fluticasone-Salmeterol (ADVAIR) 100-50 MCG/DOSE AEPB Inhale 1 puff into the lungs 2 (two) times daily.    Marland Kitchen acetaminophen (TYLENOL) 325 MG tablet Take 2 tablets (650 mg total) by mouth every 6 (six) hours as needed. (Patient not taking: Reported on 03/06/2018) 56 tablet 0  . loperamide (IMODIUM A-D) 2 MG capsule Take 2 mg by mouth as needed for diarrhea or loose stools.    .  ranitidine (ZANTAC) 150 MG tablet Take 150 mg by mouth once as needed for heartburn.    Marland Kitchen albuterol (PROVENTIL HFA;VENTOLIN HFA) 108 (90 Base) MCG/ACT inhaler Inhale 2 puffs into the lungs every 6 (six) hours as needed for wheezing or shortness of breath. (Patient not taking: Reported on 03/06/2018) 1 Inhaler 0  . ALPRAZolam (XANAX) 0.5 MG tablet Take 1 tablet (0.5 mg total) by mouth daily as needed for anxiety. (Patient not taking: Reported on 03/06/2018) 5 tablet 0  . Fluticasone-Salmeterol (ADVAIR DISKUS) 100-50 MCG/DOSE AEPB Inhale 1 puff into the lungs 2 (two) times daily. 60 each 0  . guaiFENesin-dextromethorphan (ROBITUSSIN DM) 100-10 MG/5ML syrup Take 5 mLs by mouth every 4 (four) hours as needed for cough. (Patient not taking: Reported on 03/06/2018) 118 mL 0  . ibuprofen (ADVIL,MOTRIN) 600 MG  tablet Take 1 tablet (600 mg total) by mouth every 8 (eight) hours as needed. (Patient not taking: Reported on 03/06/2018) 15 tablet 0  . levofloxacin (LEVAQUIN) 500 MG tablet Take 1 tablet (500 mg total) by mouth daily. (Patient not taking: Reported on 03/06/2018) 6 tablet 0  . predniSONE (STERAPRED UNI-PAK 21 TAB) 10 MG (21) TBPK tablet 6 tabs day 1 and taper 10 mg a day - 6 days (Patient not taking: Reported on 03/06/2018) 21 tablet 0   No facility-administered medications prior to visit.     Allergies  Allergen Reactions  . Codeine Hives, Itching and Nausea And Vomiting       Objective:    BP (!) 164/104 (BP Location: Left Arm, Patient Position: Sitting, Cuff Size: Normal)   Pulse 85   Temp 98 F (36.7 C) (Oral)   Ht '5\' 4"'  (1.626 m)   Wt 194 lb (88 kg)   SpO2 99%   BMI 33.30 kg/m  Wt Readings from Last 3 Encounters:  03/06/18 194 lb (88 kg)  12/12/17 185 lb (83.9 kg)  09/28/17 185 lb (83.9 kg)    Physical Exam  Constitutional: She is oriented to person, place, and time. She appears well-developed and well-nourished. She is cooperative.  HENT:  Head: Normocephalic and  atraumatic.  Eyes: EOM are normal.  Neck: Normal range of motion.  Cardiovascular: Normal rate, regular rhythm, normal heart sounds and intact distal pulses. Exam reveals no gallop and no friction rub.  No murmur heard. Pulmonary/Chest: Effort normal. No accessory muscle usage. No tachypnea. No respiratory distress. She has no decreased breath sounds. She has no wheezes. She has no rhonchi. She has no rales. She exhibits no tenderness.  Coarse breath sounds throughout  Abdominal: Soft. Bowel sounds are normal.  Musculoskeletal: Normal range of motion. She exhibits no edema.  Neurological: She is alert and oriented to person, place, and time. Coordination normal.  Skin: Skin is warm and dry.  Psychiatric: She has a normal mood and affect. Her behavior is normal. Judgment and thought content normal.  Nursing note and vitals reviewed.        Patient has been counseled extensively about nutrition and exercise as well as the importance of adherence with medications and regular follow-up. The patient was given clear instructions to go to ER or return to medical center if symptoms don't improve, worsen or new problems develop. The patient verbalized understanding.   Follow-up: Return in about 4 weeks (around 04/03/2018) for BP recheck, Trazodone, PAxil.   Gildardo Pounds, FNP-BC [facility] [place], [place]   03/06/2018, 5:05 PM

## 2018-03-06 NOTE — Patient Instructions (Signed)
Perimenopause Perimenopause is the time when your body begins to move into the menopause (no menstrual period for 12 straight months). It is a natural process. Perimenopause can begin 2-8 years before the menopause and usually lasts for 1 year after the menopause. During this time, your ovaries may or may not produce an egg. The ovaries vary in their production of estrogen and progesterone hormones each month. This can cause irregular menstrual periods, difficulty getting pregnant, vaginal bleeding between periods, and uncomfortable symptoms. What are the causes?  Irregular production of the ovarian hormones, estrogen and progesterone, and not ovulating every month. Other causes include:  Tumor of the pituitary gland in the brain.  Medical disease that affects the ovaries.  Radiation treatment.  Chemotherapy.  Unknown causes.  Heavy smoking and excessive alcohol intake can bring on perimenopause sooner. What are the signs or symptoms?  Hot flashes.  Night sweats.  Irregular menstrual periods.  Decreased sex drive.  Vaginal dryness.  Headaches.  Mood swings.  Depression.  Memory problems.  Irritability.  Tiredness.  Weight gain.  Trouble getting pregnant.  The beginning of losing bone cells (osteoporosis).  The beginning of hardening of the arteries (atherosclerosis). How is this diagnosed? Your health care provider will make a diagnosis by analyzing your age, menstrual history, and symptoms. He or she will do a physical exam and note any changes in your body, especially your female organs. Female hormone tests may or may not be helpful depending on the amount of female hormones you produce and when you produce them. However, other hormone tests may be helpful to rule out other problems. How is this treated? In some cases, no treatment is needed. The decision on whether treatment is necessary during the perimenopause should be made by you and your health care  provider based on how the symptoms are affecting you and your lifestyle. Various treatments are available, such as:  Treating individual symptoms with a specific medicine for that symptom.  Herbal medicines that can help specific symptoms.  Counseling.  Group therapy. Follow these instructions at home:  Keep track of your menstrual periods (when they occur, how heavy they are, how long between periods, and how long they last) as well as your symptoms and when they started.  Only take over-the-counter or prescription medicines as directed by your health care provider.  Sleep and rest.  Exercise.  Eat a diet that contains calcium (good for your bones) and soy (acts like the estrogen hormone).  Do not smoke.  Avoid alcoholic beverages.  Take vitamin supplements as recommended by your health care provider. Taking vitamin E may help in certain cases.  Take calcium and vitamin D supplements to help prevent bone loss.  Group therapy is sometimes helpful.  Acupuncture may help in some cases. Contact a health care provider if:  You have questions about any symptoms you are having.  You need a referral to a specialist (gynecologist, psychiatrist, or psychologist). Get help right away if:  You have vaginal bleeding.  Your period lasts longer than 8 days.  Your periods are recurring sooner than 21 days.  You have bleeding after intercourse.  You have severe depression.  You have pain when you urinate.  You have severe headaches.  You have vision problems. This information is not intended to replace advice given to you by your health care provider. Make sure you discuss any questions you have with your health care provider. Document Released: 06/15/2004 Document Revised: 10/14/2015 Document Reviewed: 12/05/2012 Elsevier Interactive   Patient Education  2017 Elsevier Inc.  

## 2018-03-07 LAB — CMP14+EGFR
A/G RATIO: 2.1 (ref 1.2–2.2)
ALBUMIN: 4.8 g/dL (ref 3.5–5.5)
ALT: 9 IU/L (ref 0–32)
AST: 15 IU/L (ref 0–40)
Alkaline Phosphatase: 66 IU/L (ref 39–117)
BILIRUBIN TOTAL: 0.4 mg/dL (ref 0.0–1.2)
BUN / CREAT RATIO: 18 (ref 9–23)
BUN: 14 mg/dL (ref 6–24)
CO2: 20 mmol/L (ref 20–29)
Calcium: 9.5 mg/dL (ref 8.7–10.2)
Chloride: 103 mmol/L (ref 96–106)
Creatinine, Ser: 0.78 mg/dL (ref 0.57–1.00)
GFR, EST AFRICAN AMERICAN: 103 mL/min/{1.73_m2} (ref >59)
GFR, EST NON AFRICAN AMERICAN: 90 mL/min/{1.73_m2} (ref >59)
GLOBULIN, TOTAL: 2.3 g/dL (ref 1.5–4.5)
Glucose: 119 mg/dL — ABNORMAL HIGH (ref 65–99)
POTASSIUM: 4.3 mmol/L (ref 3.5–5.2)
Sodium: 142 mmol/L (ref 134–144)
TOTAL PROTEIN: 7.1 g/dL (ref 6.0–8.5)

## 2018-03-27 ENCOUNTER — Ambulatory Visit: Payer: Self-pay | Attending: Nurse Practitioner | Admitting: Nurse Practitioner

## 2018-03-27 ENCOUNTER — Encounter: Payer: Self-pay | Admitting: Nurse Practitioner

## 2018-03-27 VITALS — BP 154/92 | HR 72 | Temp 98.2°F | Ht 64.0 in | Wt 191.6 lb

## 2018-03-27 DIAGNOSIS — J410 Simple chronic bronchitis: Secondary | ICD-10-CM

## 2018-03-27 DIAGNOSIS — Z79899 Other long term (current) drug therapy: Secondary | ICD-10-CM | POA: Insufficient documentation

## 2018-03-27 DIAGNOSIS — F17200 Nicotine dependence, unspecified, uncomplicated: Secondary | ICD-10-CM

## 2018-03-27 DIAGNOSIS — F17210 Nicotine dependence, cigarettes, uncomplicated: Secondary | ICD-10-CM | POA: Insufficient documentation

## 2018-03-27 DIAGNOSIS — F419 Anxiety disorder, unspecified: Secondary | ICD-10-CM

## 2018-03-27 DIAGNOSIS — F329 Major depressive disorder, single episode, unspecified: Secondary | ICD-10-CM

## 2018-03-27 DIAGNOSIS — I10 Essential (primary) hypertension: Secondary | ICD-10-CM

## 2018-03-27 DIAGNOSIS — R232 Flushing: Secondary | ICD-10-CM

## 2018-03-27 DIAGNOSIS — G4700 Insomnia, unspecified: Secondary | ICD-10-CM | POA: Insufficient documentation

## 2018-03-27 DIAGNOSIS — Z7951 Long term (current) use of inhaled steroids: Secondary | ICD-10-CM | POA: Insufficient documentation

## 2018-03-27 DIAGNOSIS — N951 Menopausal and female climacteric states: Secondary | ICD-10-CM | POA: Insufficient documentation

## 2018-03-27 DIAGNOSIS — E782 Mixed hyperlipidemia: Secondary | ICD-10-CM

## 2018-03-27 DIAGNOSIS — K649 Unspecified hemorrhoids: Secondary | ICD-10-CM

## 2018-03-27 DIAGNOSIS — K219 Gastro-esophageal reflux disease without esophagitis: Secondary | ICD-10-CM | POA: Insufficient documentation

## 2018-03-27 MED ORDER — NICOTINE 21 MG/24HR TD PT24: 21.0000 mg | MEDICATED_PATCH | Freq: Every day | TRANSDERMAL | 0 refills | Status: AC

## 2018-03-27 MED ORDER — NICOTINE 14 MG/24HR TD PT24: 14.0000 mg | MEDICATED_PATCH | Freq: Every day | TRANSDERMAL | 0 refills | Status: AC

## 2018-03-27 MED ORDER — LISINOPRIL 10 MG PO TABS: 10.0000 mg | ORAL_TABLET | Freq: Every day | ORAL | 3 refills | Status: DC

## 2018-03-27 MED ORDER — HYDROXYZINE HCL 25 MG PO TABS: 25.0000 mg | ORAL_TABLET | Freq: Three times a day (TID) | ORAL | 1 refills | Status: DC | PRN

## 2018-03-27 MED ORDER — PAROXETINE HCL 20 MG PO TABS: 20.0000 mg | ORAL_TABLET | Freq: Every day | ORAL | 1 refills | Status: DC

## 2018-03-27 MED ORDER — HYDROCORTISONE 2.5 % RE CREA: 1.0000 "application " | TOPICAL_CREAM | Freq: Two times a day (BID) | RECTAL | 0 refills | Status: DC

## 2018-03-27 MED ORDER — FLUTICASONE-SALMETEROL 100-50 MCG/DOSE IN AEPB: 1.0000 | INHALATION_SPRAY | Freq: Two times a day (BID) | RESPIRATORY_TRACT | 3 refills | Status: DC

## 2018-03-27 MED ORDER — ATORVASTATIN CALCIUM 10 MG PO TABS: 10.0000 mg | ORAL_TABLET | Freq: Every day | ORAL | 3 refills | Status: DC

## 2018-03-27 MED ORDER — AMLODIPINE BESYLATE 5 MG PO TABS: 5.0000 mg | ORAL_TABLET | Freq: Every day | ORAL | 3 refills | Status: DC

## 2018-03-27 MED FILL — LISINOPRIL 10 MG TABS: 10 | 30 days supply | Qty: 30 | Fill #0

## 2018-03-27 MED FILL — ATORVASTATIN 10 MG TABLET: 10 | 30 days supply | Qty: 30 | Fill #0

## 2018-03-27 MED FILL — PROCTOZONE-HC 2.5 % CREA: 2.5 | 15 days supply | Qty: 30 | Fill #0

## 2018-03-27 MED FILL — NICOTINE 21 MG/24HR PATCH: 21 | 28 days supply | Qty: 28 | Fill #0

## 2018-03-27 MED FILL — hydrOXYzine HCL 25 MG TABS: 25 | 20 days supply | Qty: 60 | Fill #0

## 2018-03-27 MED FILL — AMLODIPINE BESYLATE 5 MG TA: 5 | 30 days supply | Qty: 30 | Fill #0

## 2018-03-27 MED FILL — !ADVAIR 100/50 DISKUS: 100-50 | 30 days supply | Qty: 60 | Fill #0

## 2018-03-27 NOTE — Progress Notes (Signed)
Assessment & Plan:  Terri Hogan was seen today for follow-up.  Diagnoses and all orders for this visit:  Essential hypertension -     amLODipine (NORVASC) 5 MG tablet; Take 1 tablet (5 mg total) by mouth daily. Continue all antihypertensives as prescribed.  Remember to bring in your blood pressure log with you for your follow up appointment.  DASH/Mediterranean Diets are healthier choices for HTN.   Hot flashes Well controlled.  -     PARoxetine (PAXIL) 20 MG tablet; Take 1 tablet (20 mg total) by mouth daily.  Tobacco dependence -     nicotine (NICODERM CQ) 21 mg/24hr patch; Place 1 patch (21 mg total) onto the skin daily. -     nicotine (NICODERM CQ - DOSED IN MG/24 HOURS) 14 mg/24hr patch; Place 1 patch (14 mg total) onto the skin daily. Terri Hogan was counseled on the dangers of tobacco use, and was advised to quit. Reviewed strategies to maximize success, including removing cigarettes and smoking materials from environment, stress management and support of family/friends as well as pharmacological alternatives including: Wellbutrin, Chantix, Nicotine patch, Nicotine gum or lozenges. Smoking cessation support: smoking cessation hotline: 1-800-QUIT-NOW.  Smoking cessation classes are also available through [facility]. Call 203-273-2524 or visit our website at [email or web address]   A total of 3 minutes was spent on counseling for smoking cessation and Terri Hogan is ready to quit. Feels like the 14mg  patches were not as strong and she had breakthrough cravings for nicotine. Will try the 21mg  patches and taper down.   Simple chronic bronchitis (HCC) -     Fluticasone-Salmeterol (ADVAIR DISKUS) 100-50 MCG/DOSE AEPB; Inhale 1 puff into the lungs 2 (two) times daily. Symptoms well controlled with advair and sparing use of albuterol inhaler.   Anxiety and depression -     hydrOXYzine (ATARAX/VISTARIL) 25 MG tablet; Take 1 tablet (25 mg total) by mouth 3 (three) times daily as  needed.  Hemorrhoids, unspecified hemorrhoid type -     hydrocortisone (ANUSOL-HC) 2.5 % rectal cream; Place 1 application rectally 2 (two) times daily.  Mixed hyperlipidemia -     atorvastatin (LIPITOR) 10 MG tablet; Take 1 tablet (10 mg total) by mouth daily. INSTRUCTIONS: Work on a low fat, heart healthy diet and participate in regular aerobic exercise program by working out at least 150 minutes per week; 5 days a week-30 minutes per day. Avoid red meat, fried foods. junk foods, sodas, sugary drinks, unhealthy snacking, alcohol and smoking.  Drink at least 48oz of water per day and monitor your carbohydrate intake daily.     Patient has been counseled on age-appropriate routine health concerns for screening and prevention. These are reviewed and up-to-date. Referrals have been placed accordingly. Immunizations are up-to-date or declined.    Subjective:   Chief Complaint  Patient presents with  . Follow-up    Pt. is here for follow-up on hypertension.    HPI Terri Hogan 52 y.o. female presents to office today for follow up to HTN, anxiety and depression and increased lipids.   CHRONIC HYPERTENSION Disease Monitoring  Blood pressure not well controlled on HCTZ 25mg  daily despite medication compliance. Will add amlodipine 5mg  today.  BP Readings from Last 3 Encounters:  03/27/18 (!) 154/92  03/06/18 (!) 164/104  01/04/18 (!) 127/91    Chest pain: no   Dyspnea: yes, chronic bronchitis   Claudication: no  Medication compliance: yes  Medication Side Effects  Lightheadedness: no   Urinary frequency: no  Edema: no   Impotence: no  Preventitive Healthcare:  Exercise: no   Diet Pattern: diet: general  Salt Restriction:  No  Anxiety and Depression Chronic and stable. Both improved on paxil however she would like to increase due to intermittent episodes of mood lability and anxiety. Will increase paxil to  20mg  and add vistaril prn.   Depression screen [facility] 2/9 03/27/2018  03/06/2018 07/02/2015 11/27/2014 08/31/2014  Decreased Interest 1 3 0 3 0  Down, Depressed, Hopeless 0 3 0 2 0  PHQ - 2 Score 1 6 0 5 0  Altered sleeping 3 3 1 3  -  Tired, decreased energy 1 3 1 3  -  Change in appetite 2 3 1 3  -  Feeling bad or failure about yourself  0 1 0 0 -  Trouble concentrating 1 3 1 3  -  Moving slowly or fidgety/restless 0 1 0 2 -  Suicidal thoughts 0 0 0 0 -  PHQ-9 Score 8 20 4 19  -   GAD 7 : Generalized Anxiety Score 03/27/2018 03/06/2018 07/02/2015  Nervous, Anxious, on Edge 2 3 1   Control/stop worrying 3 3 1   Worry too much - different things 3 3 1   Trouble relaxing 3 3 1   Restless 3 3 1   Easily annoyed or irritable 2 3 1   Afraid - awful might happen 1 2 0  Total GAD 7 Score 17 20 6       Review of Systems  Constitutional: Negative for fever, malaise/fatigue and weight loss.  HENT: Negative.  Negative for nosebleeds.   Eyes: Negative.  Negative for blurred vision, double vision and photophobia.  Respiratory: Positive for cough (chronic). Negative for shortness of breath.   Cardiovascular: Negative.  Negative for chest pain, palpitations and leg swelling.  Gastrointestinal: Positive for heartburn. Negative for nausea and vomiting.       Itching painful hemorrhoids  Musculoskeletal: Negative.  Negative for myalgias.  Neurological: Negative.  Negative for dizziness, focal weakness, seizures and headaches.  Psychiatric/Behavioral: Positive for depression. Negative for suicidal ideas. The patient is nervous/anxious and has insomnia (has not picked up previous script for trazodone yet).     Past Medical History:  Diagnosis Date  . Anemia   . GERD (gastroesophageal reflux disease)    otc prn  . Insomnia   . Menorrhagia   . PONV (postoperative nausea and vomiting)   . SVD (spontaneous vaginal delivery)    x 2    Past Surgical History:  Procedure Laterality Date  . CARPAL TUNNEL RELEASE Right   . CESAREAN SECTION     x 1  . CHOLECYSTECTOMY    .  DILITATION & CURRETTAGE/HYSTROSCOPY WITH NOVASURE ABLATION N/A 10/22/2015   Procedure: DILATATION & CURETTAGE WITH NOVASURE ABLATION;  Surgeon: Emily Filbert, MD;  Location: [facility];  Service: Gynecology;  Laterality: N/A;  . TUBAL LIGATION      Family History  Problem Relation Age of Onset  . Diabetes Mother   . Cancer Father        cirrhosis    Social History Reviewed with no changes to be made today.   Outpatient Medications Prior to Visit  Medication Sig Dispense Refill  . albuterol (PROVENTIL HFA;VENTOLIN HFA) 108 (90 Base) MCG/ACT inhaler Inhale 2 puffs into the lungs every 6 (six) hours as needed for wheezing or shortness of breath. 1 Inhaler 0  . hydrochlorothiazide (HYDRODIURIL) 25 MG tablet Take 1 tablet (25 mg total) by mouth daily. 90 tablet 3  . [START ON  04/18/2018] nicotine (NICODERM CQ - DOSED IN MG/24 HR) 7 mg/24hr patch Place 1 patch (7 mg total) onto the skin daily for 28 days. 28 patch 0  . traZODone (DESYREL) 50 MG tablet Take 1-2 tablets (50-100 mg total) by mouth at bedtime. 60 tablet 0  . Fluticasone-Salmeterol (ADVAIR DISKUS) 100-50 MCG/DOSE AEPB Inhale 1 puff into the lungs 2 (two) times daily. 60 each 1  . nicotine (NICODERM CQ - DOSED IN MG/24 HOURS) 14 mg/24hr patch Place 1 patch (14 mg total) onto the skin daily. 42 patch 0  . PARoxetine (PAXIL) 10 MG tablet Take 1 tablet (10 mg total) by mouth daily. 30 tablet 1  . acetaminophen (TYLENOL) 325 MG tablet Take 2 tablets (650 mg total) by mouth every 6 (six) hours as needed. (Patient not taking: Reported on 03/06/2018) 56 tablet 0  . loperamide (IMODIUM A-D) 2 MG capsule Take 2 mg by mouth as needed for diarrhea or loose stools.    . ranitidine (ZANTAC) 150 MG tablet Take 150 mg by mouth once as needed for heartburn.     No facility-administered medications prior to visit.     Allergies  Allergen Reactions  . Codeine Hives, Itching and Nausea And Vomiting       Objective:    BP (!) 154/92 (BP Location:  Right Arm, Patient Position: Sitting, Cuff Size: Normal)   Pulse 72   Temp 98.2 F (36.8 C) (Oral)   Ht 5\' 4"  (1.626 m)   Wt 191 lb 9.6 oz (86.9 kg)   SpO2 99%   BMI 32.89 kg/m  Wt Readings from Last 3 Encounters:  03/27/18 191 lb 9.6 oz (86.9 kg)  03/06/18 194 lb (88 kg)  12/12/17 185 lb (83.9 kg)    Physical Exam  Constitutional: She is oriented to person, place, and time. She appears well-developed and well-nourished. She is cooperative.  HENT:  Head: Normocephalic and atraumatic.  Eyes: EOM are normal.  Neck: Normal range of motion.  Cardiovascular: Normal rate, regular rhythm and normal heart sounds. Exam reveals no gallop and no friction rub.  No murmur heard. Pulmonary/Chest: Effort normal. No tachypnea. No respiratory distress. She has no decreased breath sounds. She has no wheezes. She has rhonchi. She has no rales. She exhibits no tenderness.  Abdominal: Soft. Bowel sounds are normal.  Musculoskeletal: Normal range of motion. She exhibits no edema.  Neurological: She is alert and oriented to person, place, and time. Coordination normal.  Skin: Skin is warm and dry.  Psychiatric: She has a normal mood and affect. Her behavior is normal. Judgment and thought content normal.  Nursing note and vitals reviewed.      Patient has been counseled extensively about nutrition and exercise as well as the importance of adherence with medications and regular follow-up. The patient was given clear instructions to go to ER or return to medical center if symptoms don't improve, worsen or new problems develop. The patient verbalized understanding.   Follow-up: No follow-ups on file.   Gildardo Pounds, FNP-BC [facility] and [facility], [place]   03/27/2018, 6:59 PM

## 2018-04-03 ENCOUNTER — Encounter (HOSPITAL_COMMUNITY): Payer: Self-pay | Admitting: *Deleted

## 2018-04-03 ENCOUNTER — Emergency Department (HOSPITAL_COMMUNITY): Payer: Self-pay

## 2018-04-03 ENCOUNTER — Other Ambulatory Visit: Payer: Self-pay

## 2018-04-03 ENCOUNTER — Emergency Department (HOSPITAL_COMMUNITY)
Admission: EM | Admit: 2018-04-03 | Discharge: 2018-04-04 | Disposition: A | Discharge status: Home / Self Care | Payer: Self-pay | Attending: Emergency Medicine | Admitting: Emergency Medicine

## 2018-04-03 DIAGNOSIS — F17210 Nicotine dependence, cigarettes, uncomplicated: Secondary | ICD-10-CM | POA: Insufficient documentation

## 2018-04-03 DIAGNOSIS — Z043 Encounter for examination and observation following other accident: Secondary | ICD-10-CM | POA: Insufficient documentation

## 2018-04-03 MED ORDER — SULFAMETHOXAZOLE-TRIMETHOPRIM 800-160 MG PO TABS
1.0000 | ORAL_TABLET | Freq: Once | ORAL | Status: DC
  Filled 2018-04-03: qty 1

## 2018-04-03 MED ORDER — CEPHALEXIN 250 MG PO CAPS
500.0000 mg | ORAL_CAPSULE | Freq: Once | ORAL | Status: DC
  Filled 2018-04-03: qty 2

## 2018-04-03 MED ORDER — LACTATED RINGERS IV BOLUS
1000.0000 mL | Freq: Once | INTRAVENOUS | Status: AC
  Administered 2018-04-04: 1000 mL via INTRAVENOUS

## 2018-04-03 MED ORDER — KETOROLAC TROMETHAMINE 30 MG/ML IJ SOLN
15.0000 mg | Freq: Once | INTRAMUSCULAR | Status: AC
  Administered 2018-04-04: 15 mg via INTRAVENOUS
  Filled 2018-04-03: qty 1

## 2018-04-03 MED ORDER — OXYCODONE-ACETAMINOPHEN 5-325 MG PO TABS
2.0000 | ORAL_TABLET | Freq: Once | ORAL | Status: DC
  Filled 2018-04-03: qty 2

## 2018-04-03 MED ORDER — LIDOCAINE-PRILOCAINE 2.5-2.5 % EX CREA
TOPICAL_CREAM | Freq: Once | CUTANEOUS | Status: DC
  Filled 2018-04-03: qty 5

## 2018-04-03 NOTE — ED Notes (Signed)
Patient transported to X-ray 
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
## 2018-04-04 LAB — COMPREHENSIVE METABOLIC PANEL
ALT: 24 U/L (ref 0–44)
ANION GAP: 11 (ref 5–15)
AST: 23 U/L (ref 15–41)
Albumin: 3.9 g/dL (ref 3.5–5.0)
Alkaline Phosphatase: 62 U/L (ref 38–126)
BUN: 7 mg/dL (ref 6–20)
CHLORIDE: 110 mmol/L (ref 98–111)
CO2: 24 mmol/L (ref 22–32)
CREATININE: 0.83 mg/dL (ref 0.44–1.00)
Calcium: 9.5 mg/dL (ref 8.9–10.3)
Glucose, Bld: 116 mg/dL — ABNORMAL HIGH (ref 70–99)
POTASSIUM: 3.7 mmol/L (ref 3.5–5.1)
SODIUM: 145 mmol/L (ref 135–145)
Total Bilirubin: 0.1 mg/dL — ABNORMAL LOW (ref 0.3–1.2)
Total Protein: 6.5 g/dL (ref 6.5–8.1)

## 2018-04-04 LAB — CBC WITH DIFFERENTIAL/PLATELET
Abs Immature Granulocytes: 0.03 10*3/uL (ref 0.00–0.07)
BASOS PCT: 1 %
Basophils Absolute: 0.1 10*3/uL (ref 0.0–0.1)
EOS PCT: 0 %
Eosinophils Absolute: 0 10*3/uL (ref 0.0–0.5)
HCT: 45.8 % (ref 36.0–46.0)
Hemoglobin: 15 g/dL (ref 12.0–15.0)
Immature Granulocytes: 0 %
Lymphocytes Relative: 17 %
Lymphs Abs: 1.8 10*3/uL (ref 0.7–4.0)
MCH: 31 pg (ref 26.0–34.0)
MCHC: 32.8 g/dL (ref 30.0–36.0)
MCV: 94.6 fL (ref 80.0–100.0)
MONO ABS: 0.6 10*3/uL (ref 0.1–1.0)
MONOS PCT: 6 %
Neutro Abs: 7.9 10*3/uL — ABNORMAL HIGH (ref 1.7–7.7)
Neutrophils Relative %: 76 %
PLATELETS: 347 10*3/uL (ref 150–400)
RBC: 4.84 MIL/uL (ref 3.87–5.11)
RDW: 12.9 % (ref 11.5–15.5)
WBC: 10.4 10*3/uL (ref 4.0–10.5)
nRBC: 0 % (ref 0.0–0.2)

## 2018-04-04 MED ORDER — ACETAMINOPHEN 500 MG PO TABS
ORAL_TABLET | ORAL | Status: AC
  Filled 2018-04-04: qty 2

## 2018-04-04 NOTE — ED Notes (Signed)
Patient's son called [facility]). Call back number [phone]

## 2018-04-04 NOTE — ED Notes (Signed)
See downtime charting for information

## 2018-04-10 ENCOUNTER — Ambulatory Visit: Payer: Self-pay | Admitting: Pharmacist

## 2018-04-15 ENCOUNTER — Ambulatory Visit: Payer: Self-pay | Admitting: Pharmacist

## 2018-04-15 NOTE — Progress Notes (Deleted)
   S:    PCP: Geryl Rankins  Patient arrives in good spirits. Presents to the clinic for hypertension management. Patient was referred by Zelda on 03/27/18. BP at that visit was 154/92. Patient {Actions; denies-reports:120008} adherence with medications.  Current BP Medications include:  Amlodipine 5 mg daily, HCTZ 25 mg daily   Dietary habits include: *** Exercise habits include:*** Family / Social history: DM (mother) 0.5 PPD smoker, beer "occasionally"  Home BP readings: ***  O:  L arm after 5 minutes rest: ***, HR ***  Last 3 Office BP readings: BP Readings from Last 3 Encounters:  04/04/18 131/71  03/27/18 (!) 154/92  03/06/18 (!) 164/104   BMET    Component Value Date/Time   NA 145 04/04/2018 0008   NA 142 03/06/2018 1627   NA 141 10/16/2011 0318   K 3.7 04/04/2018 0008   K 3.7 10/16/2011 0318   CL 110 04/04/2018 0008   CL 109 (H) 10/16/2011 0318   CO2 24 04/04/2018 0008   CO2 24 10/16/2011 0318   GLUCOSE 116 (H) 04/04/2018 0008   GLUCOSE 114 (H) 10/16/2011 0318   BUN 7 04/04/2018 0008   BUN 14 03/06/2018 1627   BUN 9 10/16/2011 0318   CREATININE 0.83 04/04/2018 0008   CREATININE 0.79 10/16/2011 0318   CALCIUM 9.5 04/04/2018 0008   CALCIUM 7.5 (L) 10/16/2011 0318   GFRNONAA >60 04/04/2018 0008   GFRNONAA >60 10/16/2011 0318   GFRAA >60 04/04/2018 0008   GFRAA >60 10/16/2011 1694    Renal function: CrCl cannot be calculated (Unknown ideal weight.).  A/P: Hypertension longstanding/newly diagnosed currently *** on current medications. BP Goal = *** mmHg. Patient {Is/is not:9024} adherent with current medications.  -{Meds adjust:18428} ***.  -F/u labs ordered - *** -Counseled on lifestyle modifications for blood pressure control including reduced dietary sodium, increased exercise, adequate sleep  Results reviewed and written information provided.   Total time in face-to-face counseling *** minutes.   F/U Clinic Visit in ***.  Patient seen with  ***

## 2018-06-28 ENCOUNTER — Ambulatory Visit: Payer: Self-pay | Admitting: Nurse Practitioner

## 2019-01-31 ENCOUNTER — Other Ambulatory Visit: Payer: Self-pay

## 2019-01-31 ENCOUNTER — Encounter: Payer: Self-pay | Admitting: Nurse Practitioner

## 2019-01-31 ENCOUNTER — Ambulatory Visit: Payer: Self-pay | Attending: Nurse Practitioner | Admitting: Nurse Practitioner

## 2019-01-31 VITALS — BP 153/88 | HR 77 | Temp 98.4°F | Ht 64.0 in | Wt 206.0 lb

## 2019-01-31 DIAGNOSIS — E782 Mixed hyperlipidemia: Secondary | ICD-10-CM

## 2019-01-31 DIAGNOSIS — I10 Essential (primary) hypertension: Secondary | ICD-10-CM

## 2019-01-31 DIAGNOSIS — R232 Flushing: Secondary | ICD-10-CM

## 2019-01-31 DIAGNOSIS — F419 Anxiety disorder, unspecified: Secondary | ICD-10-CM

## 2019-01-31 DIAGNOSIS — F32A Depression, unspecified: Secondary | ICD-10-CM

## 2019-01-31 DIAGNOSIS — G4700 Insomnia, unspecified: Secondary | ICD-10-CM

## 2019-01-31 DIAGNOSIS — Z131 Encounter for screening for diabetes mellitus: Secondary | ICD-10-CM

## 2019-01-31 DIAGNOSIS — F17200 Nicotine dependence, unspecified, uncomplicated: Secondary | ICD-10-CM

## 2019-01-31 DIAGNOSIS — F329 Major depressive disorder, single episode, unspecified: Secondary | ICD-10-CM

## 2019-01-31 DIAGNOSIS — J410 Simple chronic bronchitis: Secondary | ICD-10-CM

## 2019-01-31 MED ORDER — ATORVASTATIN CALCIUM 10 MG PO TABS: 10.0000 mg | ORAL_TABLET | Freq: Every day | ORAL | 0 refills | Status: DC

## 2019-01-31 MED ORDER — PAROXETINE HCL 20 MG PO TABS: 20.0000 mg | ORAL_TABLET | Freq: Every day | ORAL | 0 refills | Status: DC

## 2019-01-31 MED ORDER — HYDROXYZINE HCL 50 MG PO TABS: 50.0000 mg | ORAL_TABLET | Freq: Three times a day (TID) | ORAL | 1 refills | Status: DC | PRN

## 2019-01-31 MED ORDER — HYDROCHLOROTHIAZIDE 25 MG PO TABS: 25.0000 mg | ORAL_TABLET | Freq: Every day | ORAL | 0 refills | Status: DC

## 2019-01-31 MED ORDER — FLUTICASONE-SALMETEROL 100-50 MCG/DOSE IN AEPB: 1.0000 | INHALATION_SPRAY | Freq: Two times a day (BID) | RESPIRATORY_TRACT | 3 refills | Status: DC

## 2019-01-31 MED ORDER — AMLODIPINE BESYLATE 5 MG PO TABS: 5.0000 mg | ORAL_TABLET | Freq: Every day | ORAL | 0 refills | Status: DC

## 2019-01-31 MED ORDER — QUETIAPINE FUMARATE 50 MG PO TABS: 50.0000 mg | ORAL_TABLET | Freq: Every day | ORAL | 0 refills | Status: DC

## 2019-01-31 MED ORDER — ALBUTEROL SULFATE HFA 108 (90 BASE) MCG/ACT IN AERS: 2.0000 | INHALATION_SPRAY | Freq: Four times a day (QID) | RESPIRATORY_TRACT | 1 refills | Status: DC | PRN

## 2019-01-31 MED FILL — PARoxetine HCL 20 MG TABS: 20 | 30 days supply | Qty: 30 | Fill #0

## 2019-01-31 MED FILL — HYDROCHLOROTHIAZIDE 25 MG T: 25 | 30 days supply | Qty: 30 | Fill #0

## 2019-01-31 MED FILL — hydrOXYzine HCL 50 MG TABS: 50 | 20 days supply | Qty: 60 | Fill #0

## 2019-01-31 MED FILL — ADVAIR 100/50 DISKUS: 100-50 | 30 days supply | Qty: 60 | Fill #0

## 2019-01-31 MED FILL — !VENTOLIN HFA INHALER: 108 (90 BAS | 25 days supply | Qty: 18 | Fill #0

## 2019-01-31 MED FILL — QUETIAPINE FUMARATE 50 MG T: 50 | 30 days supply | Qty: 30 | Fill #0

## 2019-01-31 MED FILL — AMLODIPINE BESYLATE 5 MG TA: 5 | 30 days supply | Qty: 30 | Fill #0

## 2019-01-31 MED FILL — ATORVASTATIN 10 MG TABLET: 10 | 30 days supply | Qty: 30 | Fill #0

## 2019-01-31 NOTE — Progress Notes (Signed)
 Assessment & Plan:  Terri Hogan was seen today for blood pressure check.  Diagnoses and all orders for this visit:  Essential hypertension -     hydrochlorothiazide (HYDRODIURIL) 25 MG tablet; Take 1 tablet (25 mg total) by mouth daily. -     amLODipine (NORVASC) 5 MG tablet; Take 1 tablet (5 mg total) by mouth daily. -     CBC -     CMP14+EGFR  Anxiety and depression -     hydrOXYzine (ATARAX/VISTARIL) 50 MG tablet; Take 1 tablet (50 mg total) by mouth 3 (three) times daily as needed for anxiety. -     PARoxetine (PAXIL) 20 MG tablet; Take 1 tablet (20 mg total) by mouth daily. -     QUEtiapine (SEROQUEL) 50 MG tablet; Take 1 tablet (50 mg total) by mouth at bedtime.  Hot flashes -     PARoxetine (PAXIL) 20 MG tablet; Take 1 tablet (20 mg total) by mouth daily.  Mixed hyperlipidemia -     atorvastatin (LIPITOR) 10 MG tablet; Take 1 tablet (10 mg total) by mouth daily. -     Lipid panel  Simple chronic bronchitis (HCC) -     Fluticasone-Salmeterol (ADVAIR DISKUS) 100-50 MCG/DOSE AEPB; Inhale 1 puff into the lungs 2 (two) times daily. -     albuterol (VENTOLIN HFA) 108 (90 Base) MCG/ACT inhaler; Inhale 2 puffs into the lungs every 6 (six) hours as needed for wheezing or shortness of breath.  Tobacco dependence Terri Hogan was counseled on the dangers of tobacco use, and was advised to quit. Reviewed strategies to maximize success, including removing cigarettes and smoking materials from environment, stress management and support of family/friends as well as pharmacological alternatives including: Wellbutrin, Chantix, Nicotine patch, Nicotine gum or lozenges. Smoking cessation support: smoking cessation hotline: 1-800-QUIT-NOW.  Smoking cessation classes are also available through [facility]. Call 336-832-9999 or visit our website at [email or web address].   A total of 3 minutes was spent on counseling for smoking cessation and Terri Hogan is not ready to quit.   Insomnia,  unspecified type -     QUEtiapine (SEROQUEL) 50 MG tablet; Take 1 tablet (50 mg total) by mouth at bedtime.  Diabetes mellitus screening -     Hemoglobin A1c Lab Results  Component Value Date   HGBA1C 5.2 03/06/2018    Patient has been counseled on age-appropriate routine health concerns for screening and prevention. These are reviewed and up-to-date. Referrals have been placed accordingly. Immunizations are up-to-date or declined.    Subjective:   Chief Complaint  Patient presents with  . Blood Pressure Check    Pt. is here for blood pressure check and medication refills.    HPI Terri Hogan 53 y.o. female presents to office today for follow up.  has a past medical history of Anemia, GERD (gastroesophageal reflux disease), Hypertension, Insomnia, Menorrhagia, PONV (postoperative nausea and vomiting), and SVD (spontaneous vaginal delivery).  It has been quite sometime since I have seen her in this office open sees 03/27/2018).  She has been out of all of her medications since last year. She is still smoking 1/2 ppd of cigarettes and weight is up 15 pounds. She was referred to [facility] for mammogram scheduling today   Essential Hypertension Blood pressure is not well controlled however she has been out of all of her antihypertensives for some time now.  Will begin amlodipine 5 mg, hydrochlorothiazide 25 mg today and have patient return in 2 to 3 weeks for repeat   blood pressure check.  She does not monitor her blood pressure at home.  She is not DASH diet or exercise compliant. Denies chest pain, shortness of breath, palpitations, lightheadedness, dizziness, headaches or BLE edema.  BP Readings from Last 3 Encounters:  01/31/19 (!) 153/88  04/04/18 131/71  03/27/18 (!) 154/92   Anxiety and Depression Patient never picked up the was ordered for home anxiety.  Will resend prescription for hydroxyzine to the pharmacy today.  She has also run out of her Paxil 20 mg which I will refill  today.  Associated symptoms of anxiety and depression including insomnia.  I have prescribed trazodone for patient's insomnia however she reports increased drowsiness upon awakening with taking trazodone 50 mg.  There was also no improvement in drowsiness with taking half a tablet 37m.  She denies any current thoughts of self-harm. [facility] 01/31/2019 03/27/2018 03/06/2018 07/02/2015 11/27/2014  Decreased Interest _0 0 3  Down, Depressed, Hopeless 2 0 3 0 2  PHQ - 2 Score _1 0 5  Altered sleeping _2 Tired, decreased energy _3 Change in appetite _4 Feeling bad or failure about yourself  0 0 1 0 0  Trouble concentrating _5 Moving slowly or fidgety/restless 0 0 1 0 2  Suicidal thoughts 0 0 0 0 0  PHQ-9 Score _6 GAD 7 : Generalized Anxiety Score 01/31/2019 03/27/2018 03/06/2018 07/02/2015  Nervous, Anxious, on Edge _7 Control/stop worrying _8 Worry too much - different things _9 Trouble relaxing _10 Restless _11 Easily annoyed or irritable _12 Afraid - awful might happen _13 0  Total GAD 7 Score _14 Review of Systems  Constitutional: Negative for fever, malaise/fatigue and weight loss.  HENT: Negative.  Negative for nosebleeds.   Eyes: Negative.  Negative for blurred vision, double vision and photophobia.  Respiratory: Negative.  Negative for cough and shortness of breath.   Cardiovascular: Negative.  Negative for chest pain, palpitations and leg swelling.  Gastrointestinal: Positive for heartburn. Negative for nausea and vomiting.  Musculoskeletal: Negative.  Negative for myalgias.  Neurological: Negative.  Negative for dizziness, focal weakness, seizures and headaches.  Psychiatric/Behavioral: Positive for depression. Negative for suicidal ideas. The patient is nervous/anxious and has insomnia.     Past Medical History:  Diagnosis Date  . Anemia   . GERD (gastroesophageal  reflux disease)    otc prn  . Hypertension   . Insomnia   . Menorrhagia   . PONV (postoperative nausea and vomiting)   . SVD (spontaneous vaginal delivery)    x 2    Past Surgical History:  Procedure Laterality Date  . CARPAL TUNNEL RELEASE Right   . CESAREAN SECTION     x 1  . CHOLECYSTECTOMY    . DILITATION & CURRETTAGE/HYSTROSCOPY WITH NOVASURE ABLATION N/A 10/22/2015   Procedure: DILATATION & CURETTAGE WITH NOVASURE ABLATION;  Surgeon: MEmily Filbert MD;  [facility];  Service: Gynecology;  Laterality: N/A;  . TUBAL LIGATION      Family History  Problem Relation Age of Onset  . Diabetes Mother   . Cancer Father        cirrhosis  Social History Reviewed with no changes to be made today.   Outpatient Medications Prior to Visit  Medication Sig Dispense Refill  . ranitidine (ZANTAC) 150 MG tablet Take 150 mg by mouth once as needed for heartburn.    Marland Kitchen albuterol (PROVENTIL HFA;VENTOLIN HFA) 108 (90 Base) MCG/ACT inhaler Inhale 2 puffs into the lungs every 6 (six) hours as needed for wheezing or shortness of breath. 1 Inhaler 0  . amLODipine (NORVASC) 5 MG tablet Take 1 tablet (5 mg total) by mouth daily. 90 tablet 3  . atorvastatin (LIPITOR) 10 MG tablet Take 1 tablet (10 mg total) by mouth daily. 90 tablet 3  . hydrochlorothiazide (HYDRODIURIL) 25 MG tablet Take 1 tablet (25 mg total) by mouth daily. 90 tablet 3  . hydrOXYzine (ATARAX/VISTARIL) 25 MG tablet Take 1 tablet (25 mg total) by mouth 3 (three) times daily as needed. 60 tablet 1  . loperamide (IMODIUM A-D) 2 MG capsule Take 2 mg by mouth as needed for diarrhea or loose stools.    Marland Kitchen acetaminophen (TYLENOL) 325 MG tablet Take 2 tablets (650 mg total) by mouth every 6 (six) hours as needed. (Patient not taking: Reported on 03/06/2018) 56 tablet 0  . Fluticasone-Salmeterol (ADVAIR DISKUS) 100-50 MCG/DOSE AEPB Inhale 1 puff into the lungs 2 (two) times daily. 60 each 3  . hydrocortisone (ANUSOL-HC) 2.5 % rectal cream  Place 1 application rectally 2 (two) times daily. (Patient not taking: Reported on 01/31/2019) 30 g 0  . PARoxetine (PAXIL) 20 MG tablet Take 1 tablet (20 mg total) by mouth daily. 90 tablet 1  . traZODone (DESYREL) 50 MG tablet Take 1-2 tablets (50-100 mg total) by mouth at bedtime. 60 tablet 0   No facility-administered medications prior to visit.     Allergies  Allergen Reactions  . Codeine Hives, Itching and Nausea And Vomiting       Objective:    BP (!) 153/88 (BP Location: Right Arm, Patient Position: Sitting, Cuff Size: Normal)   Pulse 77   Temp 98.4 F (36.9 C) (Oral)   Ht 5' 4" (1.626 m)   Wt 206 lb (93.4 kg)   SpO2 99%   BMI 35.36 kg/m  Wt Readings from Last 3 Encounters:  01/31/19 206 lb (93.4 kg)  03/27/18 191 lb 9.6 oz (86.9 kg)  03/06/18 194 lb (88 kg)    Physical Exam Vitals signs and nursing note reviewed.  Constitutional:      Appearance: She is well-developed.  HENT:     Head: Normocephalic and atraumatic.  Neck:     Musculoskeletal: Normal range of motion.  Cardiovascular:     Rate and Rhythm: Normal rate and regular rhythm.     Heart sounds: Normal heart sounds. No murmur. No friction rub. No gallop.   Pulmonary:     Effort: Pulmonary effort is normal. No tachypnea or respiratory distress.     Breath sounds: Normal breath sounds. No decreased breath sounds, wheezing, rhonchi or rales.  Chest:     Chest wall: No tenderness.  Abdominal:     General: Bowel sounds are normal.     Palpations: Abdomen is soft.  Musculoskeletal: Normal range of motion.  Skin:    General: Skin is warm and dry.  Neurological:     Mental Status: She is alert and oriented to person, place, and time.     Coordination: Coordination normal.  Psychiatric:        Behavior: Behavior normal. Behavior is cooperative.  Thought Content: Thought content normal.        Judgment: Judgment normal.        Patient has been counseled extensively about nutrition and exercise  as well as the importance of adherence with medications and regular follow-up. The patient was given clear instructions to go to ER or return to medical center if symptoms don't improve, worsen or new problems develop. The patient verbalized understanding.   Follow-up: Return for Luke 2 weeks BP recheck; see me next month or november for PAP.    W , FNP-BC [facility] [place],  [phone]   01/31/2019, 12:51 PM 

## 2019-02-01 LAB — CMP14+EGFR
ALT: 24 IU/L (ref 0–32)
AST: 18 IU/L (ref 0–40)
Albumin/Globulin Ratio: 2 (ref 1.2–2.2)
Albumin: 4.3 g/dL (ref 3.8–4.8)
Alkaline Phosphatase: 75 IU/L (ref 39–117)
BUN/Creatinine Ratio: 15 (ref 9–23)
BUN: 13 mg/dL (ref 6–24)
Bilirubin Total: <0.2 mg/dL (ref 0.0–1.2)
CO2: 24 mmol/L (ref 20–29)
Calcium: 9.2 mg/dL (ref 8.7–10.2)
Chloride: 102 mmol/L (ref 96–106)
Creatinine, Ser: 0.87 mg/dL (ref 0.57–1.00)
GFR calc Af Amer: 90 mL/min/{1.73_m2} (ref >59)
GFR calc non Af Amer: 78 mL/min/{1.73_m2} (ref >59)
Globulin, Total: 2.1 g/dL (ref 1.5–4.5)
Glucose: 106 mg/dL — ABNORMAL HIGH (ref 65–99)
Potassium: 5.1 mmol/L (ref 3.5–5.2)
Sodium: 142 mmol/L (ref 134–144)
Total Protein: 6.4 g/dL (ref 6.0–8.5)

## 2019-02-01 LAB — CBC
Hematocrit: 45.7 % (ref 34.0–46.6)
Hemoglobin: 15.4 g/dL (ref 11.1–15.9)
MCH: 31.8 pg (ref 26.6–33.0)
MCHC: 33.7 g/dL (ref 31.5–35.7)
MCV: 94 fL (ref 79–97)
Platelets: 315 10*3/uL (ref 150–450)
RBC: 4.85 x10E6/uL (ref 3.77–5.28)
RDW: 12.7 % (ref 11.7–15.4)
WBC: 7.6 10*3/uL (ref 3.4–10.8)

## 2019-02-01 LAB — LIPID PANEL
Chol/HDL Ratio: 4.9 ratio — ABNORMAL HIGH (ref 0.0–4.4)
Cholesterol, Total: 289 mg/dL — ABNORMAL HIGH (ref 100–199)
HDL: 59 mg/dL (ref >39)
LDL Chol Calc (NIH): 190 mg/dL — ABNORMAL HIGH (ref 0–99)
Triglycerides: 211 mg/dL — ABNORMAL HIGH (ref 0–149)
VLDL Cholesterol Cal: 40 mg/dL (ref 5–40)

## 2019-02-01 LAB — HEMOGLOBIN A1C
Est. average glucose Bld gHb Est-mCnc: 128 mg/dL
Hgb A1c MFr Bld: 6.1 % — ABNORMAL HIGH (ref 4.8–5.6)

## 2019-02-17 ENCOUNTER — Ambulatory Visit: Payer: Self-pay | Admitting: Pharmacist

## 2019-02-17 NOTE — Progress Notes (Deleted)
   S:    Patient arrives ***.    Presents to the clinic for BP check.  Patient was referred and last seen by Primary Care Provider on 01/31/19. 153/88 at that visit - pt reported being without meds ~1 yr. [employer] [facility] restarted medications.   Patient {Actions; denies-reports:120008} adherence with medications.  Current BP Medications include:  Amlodipine 5 mg daily, HCTZ 25 mg daily   Antihypertensives tried in the past include: lisinopril 10 mg (stopped d/t noncompliance)  Dietary habits include: previously non-compliant: *** Exercise habits include: previously non-compliant; *** Family / Social history:  - Current every day smoker (0.5 PPD)  - Alcohol:    ASCVD risk factors include:***  O:  Physical Exam   ROS  Home BP readings: ***  Last 3 Office BP readings: BP Readings from Last 3 Encounters:  01/31/19 (!) 153/88  04/04/18 131/71  03/27/18 (!) 154/92    BMET    Component Value Date/Time   NA 142 01/31/2019 0955   NA 141 10/16/2011 0318   K 5.1 01/31/2019 0955   K 3.7 10/16/2011 0318   CL 102 01/31/2019 0955   CL 109 (H) 10/16/2011 0318   CO2 24 01/31/2019 0955   CO2 24 10/16/2011 0318   GLUCOSE 106 (H) 01/31/2019 0955   GLUCOSE 116 (H) 04/04/2018 0008   GLUCOSE 114 (H) 10/16/2011 0318   BUN 13 01/31/2019 0955   BUN 9 10/16/2011 0318   CREATININE 0.87 01/31/2019 0955   CREATININE 0.79 10/16/2011 0318   CALCIUM 9.2 01/31/2019 0955   CALCIUM 7.5 (L) 10/16/2011 0318   GFRNONAA 78 01/31/2019 0955   GFRNONAA >60 10/16/2011 0318   GFRAA 90 01/31/2019 0955   GFRAA >60 10/16/2011 0318   Renal function: CrCl cannot be calculated (Unknown ideal weight.).  Clinical ASCVD: No  Last LDL (01/31/19): 190; currently on moderate intensity statin therapy  A/P: Hypertension longstanding currently *** on current medications. BP Goal = <130/80 mmHg. Patient has hx of medication non-compliance. Patient {Is/is not:9024} adherent with current medications.  -{Meds adjust:18428}  ***.  -F/u labs ordered - ***  -Counseled on lifestyle modifications for blood pressure control including reduced dietary sodium, increased exercise, adequate sleep HM: pt deferred her flu shot  Results reviewed and written information provided.   Total time in face-to-face counseling *** minutes.   F/U Clinic Visit in ***.  Patient seen with ***

## 2019-03-24 ENCOUNTER — Other Ambulatory Visit: Payer: Self-pay | Admitting: Nurse Practitioner

## 2019-03-26 MED FILL — FLUTICASONE-SALMETEROL 100-: 100-50 | 30 days supply | Qty: 60 | Fill #1

## 2019-03-26 MED FILL — ATORVASTATIN 10 MG TABLET: 10 | 30 days supply | Qty: 30 | Fill #1

## 2019-03-26 MED FILL — PARoxetine HCL 20 MG TABS: 20 | 30 days supply | Qty: 30 | Fill #1

## 2019-03-26 MED FILL — HYDROCHLOROTHIAZIDE 25 MG T: 25 | 30 days supply | Qty: 30 | Fill #1

## 2019-03-26 MED FILL — hydrOXYzine HCL 50 MG TABS: 50 | 20 days supply | Qty: 60 | Fill #1

## 2019-03-26 MED FILL — !VENTOLIN HFA INHALER: 108 (90 BAS | 25 days supply | Qty: 18 | Fill #1

## 2019-03-26 MED FILL — AMLODIPINE BESYLATE 5 MG TA: 5 | 30 days supply | Qty: 30 | Fill #1

## 2019-08-11 ENCOUNTER — Emergency Department (HOSPITAL_COMMUNITY): Payer: Self-pay

## 2019-08-11 ENCOUNTER — Telehealth: Payer: Self-pay | Admitting: Nurse Practitioner

## 2019-08-11 ENCOUNTER — Encounter (HOSPITAL_COMMUNITY): Payer: Self-pay | Admitting: Emergency Medicine

## 2019-08-11 ENCOUNTER — Emergency Department (HOSPITAL_COMMUNITY)
Admission: EM | Admit: 2019-08-11 | Discharge: 2019-08-11 | Disposition: A | Discharge status: Home / Self Care | Payer: Self-pay | Attending: Emergency Medicine | Admitting: Emergency Medicine

## 2019-08-11 ENCOUNTER — Other Ambulatory Visit: Payer: Self-pay

## 2019-08-11 DIAGNOSIS — F17210 Nicotine dependence, cigarettes, uncomplicated: Secondary | ICD-10-CM | POA: Insufficient documentation

## 2019-08-11 DIAGNOSIS — Z79899 Other long term (current) drug therapy: Secondary | ICD-10-CM | POA: Insufficient documentation

## 2019-08-11 DIAGNOSIS — M79642 Pain in left hand: Secondary | ICD-10-CM | POA: Insufficient documentation

## 2019-08-11 DIAGNOSIS — I10 Essential (primary) hypertension: Secondary | ICD-10-CM | POA: Insufficient documentation

## 2019-08-11 MED ORDER — MELOXICAM 15 MG PO TABS: 15.0000 mg | ORAL_TABLET | Freq: Every day | ORAL | 0 refills | Status: DC

## 2019-08-11 NOTE — Telephone Encounter (Signed)
1) Medication(s) Requested (by name): albuterol (VENTOLIN HFA) 108 (90 Base) MCG/ACT inhaler [id number]  2) Pharmacy of Choice: [place] 3) Special Requests:  Pt has a appointment for physical/pap for April 15 but needs this medication until then.  Approved medications will be sent to the pharmacy, we will reach out if there is an issue.  Requests made after 3pm may not be addressed until the following business day!  If a patient is unsure of the name of the medication(s) please note and ask patient to call back when they are able to provide all info, do not send to responsible party until all information is available!

## 2019-08-11 NOTE — ED Notes (Signed)
Patient Alert and oriented to baseline. Stable and ambulatory to baseline. Patient verbalized understanding of the discharge instructions.  Patient belongings were taken by the patient.   

## 2019-08-11 NOTE — ED Provider Notes (Signed)
[facility] Provider Note   CSN: [id number] Arrival date & time: 08/11/19  1514     History Chief Complaint  Patient presents with  . Hand Pain    Terri Hogan is a 54 y.o. female with a history of tobacco abuse, hypertension, insomnia, anemia & prior tubal ligation who presents to the ED with complaints of L hand pain x 1 month. Patient states pain is intermittent, worse with certain movements & grasping of items, no alleviating factors. Has tried motrin/tylenol without much relief. States pain is primarily to the L thumb, radiates into the rest and the remaining portion of the hand with aggravating factors. She feels there is a popping @ her 1st IP joint with certain movements. No recent change in activity or injury she can recall. She is L hand dominant, cares for her grandchildren but does not lift them frequently. Denies redness, warmth, swelling, wounds, numbness, or weakness.   HPI     Past Medical History:  Diagnosis Date  . Anemia   . GERD (gastroesophageal reflux disease)    otc prn  . Hypertension   . Insomnia   . Menorrhagia   . PONV (postoperative nausea and vomiting)   . SVD (spontaneous vaginal delivery)    x 2    Patient Active Problem List   Diagnosis Date Noted  . Pneumonia 12/12/2017  . Generalized anxiety disorder 07/02/2015  . Current smoker 08/31/2014  . 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 Fibroids, intramural 08/31/2014  . Menorrhagia with regular cycle 08/12/2014    Past Surgical History:  Procedure Laterality Date  . CARPAL TUNNEL RELEASE Right   . CESAREAN SECTION     x 1  . CHOLECYSTECTOMY    . DILITATION & CURRETTAGE/HYSTROSCOPY WITH NOVASURE ABLATION N/A 10/22/2015   Procedure: DILATATION & CURETTAGE WITH NOVASURE ABLATION;  Surgeon: Emily Filbert, MD;  Location: [facility];  Service: Gynecology;  Laterality: N/A;  . TUBAL LIGATION       OB History    Gravida  4   Para  4   Term  4   Preterm  0     AB  0   Living  3     SAB  0   TAB      Ectopic      Multiple      Live Births              Family History  Problem Relation Age of Onset  . Diabetes Mother   . Cancer Father        cirrhosis    Social History   Tobacco Use  . Smoking status: Current Every Day Smoker    Packs/day: 0.50    Years: 20.00    Pack years: 10.00    Types: Cigarettes  . Smokeless tobacco: Never Used  Substance Use Topics  . Alcohol use: Yes    Comment: occasionally beer  . Drug use: Yes    Types: Anabolic steroids    Home Medications Prior to Admission medications   Medication Sig Start Date End Date Taking? Authorizing Provider  albuterol (VENTOLIN HFA) 108 (90 Base) MCG/ACT inhaler Inhale 2 puffs into the lungs every 6 (six) hours as needed for wheezing or shortness of breath. 01/31/19   Gildardo Pounds, NP  amLODipine (NORVASC) 5 MG tablet Take 1 tablet (5 mg total) by mouth daily. 01/31/19   Gildardo Pounds, NP  atorvastatin (LIPITOR) 10 MG tablet Take  1 tablet (10 mg total) by mouth daily. 01/31/19   Gildardo Pounds, NP  Fluticasone-Salmeterol (ADVAIR DISKUS) 100-50 MCG/DOSE AEPB Inhale 1 puff into the lungs 2 (two) times daily. 01/31/19 03/02/19  Gildardo Pounds, NP  hydrochlorothiazide (HYDRODIURIL) 25 MG tablet Take 1 tablet (25 mg total) by mouth daily. 01/31/19   Gildardo Pounds, NP  hydrOXYzine (ATARAX/VISTARIL) 50 MG tablet Take 1 tablet (50 mg total) by mouth 3 (three) times daily as needed for anxiety. 01/31/19   Gildardo Pounds, NP  loperamide (IMODIUM A-D) 2 MG capsule Take 2 mg by mouth as needed for diarrhea or loose stools.    [provider]  PARoxetine (PAXIL) 20 MG tablet Take 1 tablet (20 mg total) by mouth daily. 01/31/19 05/01/19  Gildardo Pounds, NP  QUEtiapine (SEROQUEL) 50 MG tablet Take 1 tablet (50 mg total) by mouth at bedtime. 01/31/19   Gildardo Pounds, NP  ranitidine (ZANTAC) 150 MG tablet Take 150 mg by mouth once as needed for heartburn.     [provider]    Allergies    Codeine  Review of Systems   Review of Systems  Constitutional: Negative for chills and fever.  Respiratory: Negative for shortness of breath.   Cardiovascular: Negative for chest pain.  Musculoskeletal: Positive for arthralgias. Negative for joint swelling.  Skin: Negative for color change, rash and wound.  Neurological: Negative for weakness and numbness.    Physical Exam Updated Vital Signs BP 111/63 (BP Location: Right Arm)   Pulse 83   Temp 98.1 F (36.7 C) (Oral)   Resp 20   SpO2 99%   Physical Exam Vitals and nursing note reviewed.  Constitutional:      General: She is not in acute distress.    Appearance: Normal appearance. She is not ill-appearing or toxic-appearing.  HENT:     Head: Normocephalic and atraumatic.  Neck:     Comments: No midline tenderness.  Cardiovascular:     Rate and Rhythm: Normal rate.     Pulses:          Radial pulses are 2+ on the right side and 2+ on the left side.  Pulmonary:     Effort: No respiratory distress.     Breath sounds: Normal breath sounds.  Musculoskeletal:     Cervical back: Normal range of motion and neck supple.     Comments: Upper extremities: No obvious deformity, appreciable swelling, edema, erythema, ecchymosis, warmth, or open wounds. LUE 4th digit flexor tendon is somewhat prominent, but no overlying signs of infection, nodules, or masses. Patient has intact AROM throughout- pain with L thumb IP/MCP joint flexion. Tender to palpation over the L 1st IP joint, proximal phanax, & MCP as well as to the area of the L abductor pollicis longus and extensor pollicis brevis tendons. Otherwise nontender. + finkelstein of the LUE.   Skin:    General: Skin is warm and dry.     Capillary Refill: Capillary refill takes less than 2 seconds.  Neurological:     Mental Status: She is alert.     Comments: Alert. Clear speech. Sensation grossly intact to bilateral upper extremities. 5/5  symmetric grip strength. Ambulatory. Able to perform OK sign, thumbs up and cross 2nd/3rd digits bilaterally.   Psychiatric:        Mood and Affect: Mood normal.        Behavior: Behavior normal.    ED Results / Procedures / Treatments   Labs (all  labs ordered are listed, but only abnormal results are displayed) Labs Reviewed - No data to display  EKG None  Radiology DG Hand Complete Left  Result Date: 08/11/2019 CLINICAL DATA:  Hand pain for 1 month, no known injury, initial encounter EXAM: LEFT HAND - COMPLETE 3+ VIEW COMPARISON:  07/30/2012 FINDINGS: Ulnar minus variant is noted. No acute fracture or dislocation is seen. No soft tissue abnormality is noted. IMPRESSION: No acute abnormality seen. Electronically Signed   By: Inez Catalina M.D.   On: 08/11/2019 16:24    Procedures Procedures (including critical care time)  Medications Ordered in ED Medications - No data to display  ED Course  I have reviewed the triage vital signs and the nursing notes.  Pertinent labs & imaging results that were available during my care of the patient were reviewed by me and considered in my medical decision making (see chart for details).    MDM Rules/Calculators/A&P                     Patient presents to the ED with complaints of L hand pain intermittently x 1 month. Nontoxic, resting comfortably, vitals WNL. No erythema/warmth/fever/wounds, ROM intact, not consistent w/ infectious process such as septic joint, infectious flexor tenosynovitis, or cellulitis. Xray w/o acute process- no fracture/dislocations. Overall unclear definitive etiology, considering de Quervain's tenosynovitis with thumb discomfort, also considering a possibly early Dupuytren's contracture forming to 4th digit with somewhat prominent tendon but no obvious nodule and maintains good ROM, again does not appear consistent w/ infection. Will place patient in thumb spica brace, trial PRICE, prescription for meloxicam (chart  reviewed for additional information, last creatinine WNL) & hand surgery follow up. I discussed results, treatment plan, need for follow-up, and return precautions with the patient. Provided opportunity for questions, patient confirmed understanding and is in agreement with plan.    Final Clinical Impression(s) / ED Diagnoses Final diagnoses:  Left hand pain    Rx / DC Orders ED Discharge Orders         Ordered    meloxicam (MOBIC) 15 MG tablet  Daily     08/11/19 [address] PA-C 08/11/19 [place], [place], DO 08/12/19 [phone]

## 2019-08-11 NOTE — Progress Notes (Signed)
Orthopedic Tech Progress Note Patient Details:  Terri Hogan 1965/08/04 [id number]  Ortho Devices Type of Ortho Device: Thumb velcro splint Ortho Device/Splint Location: LUE Ortho Device/Splint Interventions: Application, Ordered   Post Interventions Patient Tolerated: Well Instructions Provided: Care of device   [name] 08/11/2019, 6:45 PM

## 2019-08-11 NOTE — ED Triage Notes (Addendum)
Left hand pain and thumb pain states thumb pops in and out x 1 month

## 2019-08-11 NOTE — Discharge Instructions (Addendum)
Please read and follow all provided instructions.  You have been seen today for left hand pain/popping.   Tests performed today include: An x-ray of the affected area - does NOT show any broken bones or dislocations.  Vital signs. See below for your results today.   Home care instructions: -- *PRICE:  Protect (with brace, splint, sling), if given by your provider Rest Ice- Do not apply ice pack directly to your skin, place towel or similar between your skin and ice/ice pack. Apply ice for 20 min, then remove for 40 min while awake for the next 24 hours, then as needed.  Compression- Wear brace, elastic bandage, splint as directed by your provider Elevate affected extremity above the level of your heart when not walking around for the first 24-48 hours   Medications:  - Meloxicam is a nonsteroidal anti-inflammatory medication that will help with pain and swelling. Be sure to take this medication as prescribed with food, 1 pill every 12 hours,  It should be taken with food, as it can cause stomach upset, and more seriously, stomach bleeding. Do not take other nonsteroidal anti-inflammatory medications with this such as Advil, Motrin, Aleve, Mobic, naproxen, naprosyn, Goodie Powder, or Motrin.    You make take Tylenol per over the counter dosing with these medications.   We have prescribed you new medication(s) today. Discuss the medications prescribed today with your pharmacist as they can have adverse effects and interactions with your other medicines including over the counter and prescribed medications. Seek medical evaluation if you start to experience new or abnormal symptoms after taking one of these medicines, seek care immediately if you start to experience difficulty breathing, feeling of your throat closing, facial swelling, or rash as these could be indications of a more serious allergic reaction   Follow-up instructions: Please follow-up with the orthopedic hand surgeon in your  discharge instructions for re-evaluation    Return instructions:  Please return if your digits or extremity are numb or tingling, appear gray or blue, or you have severe pain (also elevate the extremity and loosen splint or wrap if you were given one) Please return if you have redness or fevers.  Please return to the Emergency Department if you experience worsening symptoms.  Please return if you have any other emergent concerns. Additional Information:  Your vital signs today were: BP 111/63 (BP Location: Right Arm)   Pulse 83   Temp 98.1 F (36.7 C) (Oral)   Resp 20   SpO2 99%  If your blood pressure (BP) was elevated above 135/85 this visit, please have this repeated by your doctor within one month. ---------------

## 2019-08-13 ENCOUNTER — Other Ambulatory Visit: Payer: Self-pay | Admitting: Nurse Practitioner

## 2019-08-13 DIAGNOSIS — J410 Simple chronic bronchitis: Secondary | ICD-10-CM

## 2019-08-13 MED FILL — AMLODIPINE BESYLATE 5 MG TA: 5 | 30 days supply | Qty: 30 | Fill #2

## 2019-08-13 MED FILL — FLUTICASONE-SALMETEROL 100-: 100-50 | 30 days supply | Qty: 60 | Fill #2

## 2019-08-13 MED FILL — HYDROCHLOROTHIAZIDE 25 MG T: 25 | 30 days supply | Qty: 30 | Fill #2

## 2019-08-13 MED FILL — ALBUTEROL SULFATE HFA 108 (: 108 (90 BAS | 25 days supply | Qty: 18 | Fill #0

## 2019-08-13 MED FILL — ATORVASTATIN 10 MG TABLET: 10 | 30 days supply | Qty: 30 | Fill #2

## 2019-08-18 NOTE — Telephone Encounter (Signed)
Attempt to reach patient to inform on medication refill. No answer and phone line is busy.

## 2019-09-04 ENCOUNTER — Ambulatory Visit: Payer: Self-pay | Admitting: Nurse Practitioner

## 2019-09-05 ENCOUNTER — Ambulatory Visit: Payer: Self-pay | Admitting: Nurse Practitioner

## 2019-11-20 ENCOUNTER — Other Ambulatory Visit: Payer: Self-pay | Admitting: Nurse Practitioner

## 2019-11-20 DIAGNOSIS — R232 Flushing: Secondary | ICD-10-CM

## 2019-11-20 DIAGNOSIS — I10 Essential (primary) hypertension: Secondary | ICD-10-CM

## 2019-11-20 DIAGNOSIS — F419 Anxiety disorder, unspecified: Secondary | ICD-10-CM

## 2019-11-20 DIAGNOSIS — G4700 Insomnia, unspecified: Secondary | ICD-10-CM

## 2019-11-20 DIAGNOSIS — J410 Simple chronic bronchitis: Secondary | ICD-10-CM

## 2019-11-20 MED ORDER — FLUTICASONE-SALMETEROL 100-50 MCG/DOSE IN AEPB: 1.0000 | INHALATION_SPRAY | Freq: Two times a day (BID) | RESPIRATORY_TRACT | 0 refills | Status: DC

## 2019-11-20 MED ORDER — PAROXETINE HCL 20 MG PO TABS: 20.0000 mg | ORAL_TABLET | Freq: Every day | ORAL | 0 refills | Status: DC

## 2019-11-20 MED ORDER — AMLODIPINE BESYLATE 5 MG PO TABS: 5.0000 mg | ORAL_TABLET | Freq: Every day | ORAL | 0 refills | Status: DC

## 2019-11-20 MED ORDER — ALBUTEROL SULFATE HFA 108 (90 BASE) MCG/ACT IN AERS: INHALATION_SPRAY | RESPIRATORY_TRACT | 0 refills | Status: DC

## 2019-11-20 MED ORDER — HYDROCHLOROTHIAZIDE 25 MG PO TABS: 25.0000 mg | ORAL_TABLET | Freq: Every day | ORAL | 0 refills | Status: DC

## 2019-11-20 MED ORDER — ATORVASTATIN CALCIUM 10 MG PO TABS: 10.0000 mg | ORAL_TABLET | Freq: Every day | ORAL | 0 refills | Status: DC

## 2019-11-20 MED FILL — ATORVASTATIN 10 MG TABLET: 10 | 30 days supply | Qty: 30 | Fill #0

## 2019-11-20 MED FILL — FLUTICASONE-SALMETEROL 100-: 100-50 | 30 days supply | Qty: 60 | Fill #0

## 2019-11-20 MED FILL — PARoxetine HCL 20 MG TABS: 20 | 30 days supply | Qty: 30 | Fill #0

## 2019-11-20 MED FILL — AMLODIPINE BESYLATE 5 MG TA: 5 | 30 days supply | Qty: 30 | Fill #0

## 2019-11-20 MED FILL — HYDROCHLOROTHIAZIDE 25 MG T: 25 | 30 days supply | Qty: 30 | Fill #0

## 2019-11-20 MED FILL — ALBUTEROL SULFATE HFA 108 (: 108 (90 BAS | 25 days supply | Qty: 9 | Fill #0

## 2019-11-20 NOTE — Telephone Encounter (Signed)
Courtesy refill Attempt to reach patient several times and line keeps ringing busy

## 2019-11-20 NOTE — Telephone Encounter (Signed)
Medication Refill - Medication: Patient stated that she needs a refill on all medications.  Has the patient contacted their pharmacy?yes (Agent: If no, request that the patient contact the pharmacy for the refill.) (Agent: If yes, when and what did the pharmacy advise?)Contact PCP  Preferred Pharmacy (with phone number or street name):  [facility] [place] [place] [address] Phone:  [phone]  Fax:  [phone]       Agent: Please be advised that RX refills may take up to 3 business days. We ask that you follow-up with your pharmacy.

## 2019-11-20 NOTE — Telephone Encounter (Signed)
Requested medication (s) are due for refill today: yes  Requested medication (s) are on the active medication list: yes  Last refill:  01/31/2019  Future visit scheduled: no  Notes to clinic:  patient was given a 30 day courtesy refill on meds This refill cannot be delegated  Attempted to reach patient several times but phone keeps ringing busy  Requested Prescriptions  Pending Prescriptions Disp Refills   QUEtiapine (SEROQUEL) 50 MG tablet 90 tablet 0    Sig: Take 1 tablet (50 mg total) by mouth at bedtime.      Not Delegated - Psychiatry:  Antipsychotics - Second Generation (Atypical) - quetiapine Failed - 11/20/2019  9:59 AM      Failed - This refill cannot be delegated      Failed - ALT in normal range and within 180 days    ALT  Date Value Ref Range Status  01/31/2019 24 0 - 32 IU/L Final          Failed - AST in normal range and within 180 days    AST  Date Value Ref Range Status  01/31/2019 18 0 - 40 IU/L Final          Failed - Valid encounter within last 6 months    Recent Outpatient Visits           9 months ago Essential hypertension   [place], Vernia Buff, NP   1 year ago Essential hypertension   [place], Vernia Buff, NP   1 year ago Essential hypertension   [place], Vernia Buff, NP   4 years ago [place], [place], MD   4 years ago Menorrhagia with regular cycle   [place], [place], MD              Passed - Last BP in normal range    BP Readings from Last 1 Encounters:  08/11/19 111/63           Signed Prescriptions Disp Refills   amLODipine (NORVASC) 5 MG tablet 30 tablet 0    Sig: Take 1 tablet (5 mg total) by mouth daily.      Cardiovascular:  Calcium Channel Blockers Failed - 11/20/2019  9:59 AM      Failed - Valid  encounter within last 6 months    Recent Outpatient Visits           9 months ago Essential hypertension   [place], Vernia Buff, NP   1 year ago Essential hypertension   [place], Vernia Buff, NP   1 year ago Essential hypertension   [place], Vernia Buff, NP   4 years ago [place], [place], MD   4 years ago Menorrhagia with regular cycle   [place], [place], MD              Passed - Last BP in normal range    BP Readings from Last 1 Encounters:  08/11/19 111/63            atorvastatin (LIPITOR) 10 MG tablet 30 tablet 0    Sig: Take 1 tablet (10 mg total) by mouth daily.  Cardiovascular:  Antilipid - Statins Failed - 11/20/2019  9:59 AM      Failed - Total Cholesterol in normal range and within 360 days    Cholesterol, Total  Date Value Ref Range Status  01/31/2019 289 (H) 100 - 199 mg/dL Final          Failed - LDL in normal range and within 360 days    LDL Chol Calc (NIH)  Date Value Ref Range Status  01/31/2019 190 (H) 0 - 99 mg/dL Final          Failed - Triglycerides in normal range and within 360 days    Triglycerides  Date Value Ref Range Status  01/31/2019 211 (H) 0 - 149 mg/dL Final          Passed - HDL in normal range and within 360 days    HDL  Date Value Ref Range Status  01/31/2019 59 >39 mg/dL Final          Passed - Patient is not pregnant      Passed - Valid encounter within last 12 months    Recent Outpatient Visits           9 months ago Essential hypertension   [place], Vernia Buff, NP   1 year ago Essential hypertension   [place], Vernia Buff, NP   1 year ago Essential hypertension   [place],  Vernia Buff, NP   4 years ago [place], [place], MD   4 years ago Menorrhagia with regular cycle   [place], [place], MD                hydrochlorothiazide (HYDRODIURIL) 25 MG tablet 90 tablet 0    Sig: Take 1 tablet (25 mg total) by mouth daily.      Cardiovascular: Diuretics - Thiazide Failed - 11/20/2019  9:59 AM      Failed - Valid encounter within last 6 months    Recent Outpatient Visits           9 months ago Essential hypertension   [place], Vernia Buff, NP   1 year ago Essential hypertension   [place], Vernia Buff, NP   1 year ago Essential hypertension   [place], Vernia Buff, NP   4 years ago [place], [place], MD   4 years ago Menorrhagia with regular cycle   [place], [place], MD              Passed - Ca in normal range and within 360 days    Calcium  Date Value Ref Range Status  01/31/2019 9.2 8.7 - 10.2 mg/dL Final   Calcium, Total  Date Value Ref Range Status  10/16/2011 7.5 (L) 8.5 - 10.1 mg/dL Final   Calcium, Ion  Date Value Ref Range Status  11/24/2014 1.13 1.12 - 1.23 mmol/L Final          Passed - Cr in normal range and within 360 days    Creatinine  Date Value Ref Range Status  10/16/2011 0.79 0.60 - 1.30 mg/dL Final   Creatinine, Ser  Date Value Ref Range Status  01/31/2019 0.87  0.57 - 1.00 mg/dL Final          Passed - K in normal range and within 360 days    Potassium  Date Value Ref Range Status  01/31/2019 5.1 3.5 - 5.2 mmol/L Final  10/16/2011 3.7 3.5 - 5.1 mmol/L Final          Passed - Na in normal range and within 360 days    Sodium  Date Value Ref Range Status  01/31/2019 142 134 - 144 mmol/L  Final  10/16/2011 141 136 - 145 mmol/L Final          Passed - Last BP in normal range    BP Readings from Last 1 Encounters:  08/11/19 111/63            Fluticasone-Salmeterol (ADVAIR DISKUS) 100-50 MCG/DOSE AEPB 60 each 0    Sig: Inhale 1 puff into the lungs 2 (two) times daily.      Pulmonology:  Combination Products Passed - 11/20/2019  9:59 AM      Passed - Valid encounter within last 12 months    Recent Outpatient Visits           9 months ago Essential hypertension   [place], Vernia Buff, NP   1 year ago Essential hypertension   [place], Zelda W, NP   1 year ago Essential hypertension   [place], Vernia Buff, NP   4 years ago [place], [place], MD   4 years ago Menorrhagia with regular cycle   [place], [place], MD                PARoxetine (PAXIL) 20 MG tablet 90 tablet 0    Sig: Take 1 tablet (20 mg total) by mouth daily.      Psychiatry:  Antidepressants - SSRI Failed - 11/20/2019  9:59 AM      Failed - Valid encounter within last 6 months    Recent Outpatient Visits           9 months ago Essential hypertension   [place], Vernia Buff, NP   1 year ago Essential hypertension   [place], Vernia Buff, NP   1 year ago Essential hypertension   [place], Vernia Buff, NP   4 years ago [place] Boykin Nearing, MD   4 years ago Menorrhagia with regular cycle   [place], [place], MD                albuterol (VENTOLIN HFA) 108 (90 Base) MCG/ACT inhaler 6.7 g 0    Sig: INHALE 2 PUFFS INTO THE LUNGS EVERY 6 (SIX) HOURS AS NEEDED  FOR WHEEZING OR SHORTNESS OF BREATH.      Pulmonology:  Beta Agonists Failed - 11/20/2019  9:59 AM      Failed - One inhaler should last at least one month. If the patient is requesting refills earlier, contact the patient to check for uncontrolled symptoms.      Passed - Valid encounter within last 12 months    Recent Outpatient Visits           9 months ago Essential hypertension   [place], Vernia Buff, NP   1 year ago Essential hypertension   [place], Vernia Buff, NP   1 year ago Essential hypertension   [place], Vernia Buff, NP   4 years ago [place] Boykin Nearing, MD   4 years ago Menorrhagia with regular cycle   [facility] [place] Nearing, MD

## 2019-11-26 ENCOUNTER — Telehealth: Payer: Self-pay

## 2019-11-26 NOTE — Telephone Encounter (Signed)
Copied from [place] [id number]. Topic: General - Other >> Nov 20, 2019  9:50 AM Rainey Pines A wrote: Patient wants to get a message back to her PCP in regards to experiencing frequent dry mouth and ringing in her ears. Patient was advised of next available opening to book an appt however did not want to book out that far into the month. Patient is wanting to speak with Dr. Leane Platt nurse about what she is experiencing and to know what Dr. Raul Del would further advise.

## 2019-11-27 NOTE — Telephone Encounter (Signed)
Refill requested

## 2019-11-28 NOTE — Telephone Encounter (Signed)
Attempt to reach patient regarding her symptoms. No answer and phone line was busy. Will route to PCP for recommendation.

## 2019-12-02 MED ORDER — QUETIAPINE FUMARATE 50 MG PO TABS: 50.0000 mg | ORAL_TABLET | Freq: Every day | ORAL | 0 refills | Status: DC

## 2019-12-02 NOTE — Telephone Encounter (Signed)
She needs a televisit. It could be related to her blood pressure medication HCTZ. She can stop taking this for a week to see if this improves her symptoms.

## 2019-12-03 MED FILL — QUETIAPINE FUMARATE 50 MG T: 50 | 30 days supply | Qty: 30 | Fill #0

## 2019-12-03 NOTE — Telephone Encounter (Signed)
Attempt to reach patient to inform on PCP advising and to schedule a televisit. No answer. Phone line busy, unable to LVM.

## 2019-12-10 ENCOUNTER — Other Ambulatory Visit: Payer: Self-pay

## 2019-12-10 ENCOUNTER — Emergency Department (HOSPITAL_COMMUNITY)
Admission: EM | Admit: 2019-12-10 | Discharge: 2019-12-11 | Disposition: A | Discharge status: Home / Self Care | Payer: Self-pay | Attending: Emergency Medicine | Admitting: Emergency Medicine

## 2019-12-10 ENCOUNTER — Encounter (HOSPITAL_COMMUNITY): Payer: Self-pay | Admitting: *Deleted

## 2019-12-10 DIAGNOSIS — R109 Unspecified abdominal pain: Secondary | ICD-10-CM | POA: Insufficient documentation

## 2019-12-10 DIAGNOSIS — Z5321 Procedure and treatment not carried out due to patient leaving prior to being seen by health care provider: Secondary | ICD-10-CM | POA: Insufficient documentation

## 2019-12-10 LAB — COMPREHENSIVE METABOLIC PANEL
ALT: 33 U/L (ref 0–44)
AST: 23 U/L (ref 15–41)
Albumin: 4.6 g/dL (ref 3.5–5.0)
Alkaline Phosphatase: 88 U/L (ref 38–126)
Anion gap: 15 (ref 5–15)
BUN: 8 mg/dL (ref 6–20)
CO2: 20 mmol/L — ABNORMAL LOW (ref 22–32)
Calcium: 10.1 mg/dL (ref 8.9–10.3)
Chloride: 105 mmol/L (ref 98–111)
Creatinine, Ser: 0.96 mg/dL (ref 0.44–1.00)
GFR calc Af Amer: >60 mL/min (ref >60)
GFR calc non Af Amer: >60 mL/min (ref >60)
Glucose, Bld: 137 mg/dL — ABNORMAL HIGH (ref 70–99)
Potassium: 3 mmol/L — ABNORMAL LOW (ref 3.5–5.1)
Sodium: 140 mmol/L (ref 135–145)
Total Bilirubin: 0.8 mg/dL (ref 0.3–1.2)
Total Protein: 7.7 g/dL (ref 6.5–8.1)

## 2019-12-10 LAB — CBC
HCT: 48.8 % — ABNORMAL HIGH (ref 36.0–46.0)
Hemoglobin: 16.7 g/dL — ABNORMAL HIGH (ref 12.0–15.0)
MCH: 30.4 pg (ref 26.0–34.0)
MCHC: 34.2 g/dL (ref 30.0–36.0)
MCV: 88.7 fL (ref 80.0–100.0)
Platelets: 431 10*3/uL — ABNORMAL HIGH (ref 150–400)
RBC: 5.5 MIL/uL — ABNORMAL HIGH (ref 3.87–5.11)
RDW: 12.4 % (ref 11.5–15.5)
WBC: 12.3 10*3/uL — ABNORMAL HIGH (ref 4.0–10.5)
nRBC: 0 % (ref 0.0–0.2)

## 2019-12-10 LAB — LIPASE, BLOOD: Lipase: 35 U/L (ref 11–51)

## 2019-12-10 NOTE — ED Triage Notes (Signed)
The pt is c/o abd pain for 2 days she has been unable to eat or drink without extreme pain  lmp none

## 2019-12-11 NOTE — ED Notes (Signed)
Pt called for vitals x3. No answer 

## 2019-12-17 ENCOUNTER — Other Ambulatory Visit: Payer: Self-pay

## 2019-12-17 ENCOUNTER — Ambulatory Visit: Payer: Self-pay | Attending: Nurse Practitioner | Admitting: Internal Medicine

## 2020-02-03 ENCOUNTER — Other Ambulatory Visit: Payer: Self-pay | Admitting: Nurse Practitioner

## 2020-02-03 DIAGNOSIS — J410 Simple chronic bronchitis: Secondary | ICD-10-CM

## 2020-02-03 NOTE — Telephone Encounter (Signed)
Pt request refill  albuterol (VENTOLIN HFA) 108 (90 Base) MCG/ACT inhaler  Fluticasone-Salmeterol (ADVAIR DISKUS) 100-50 MCG/DOSE [place] [place] [facility] [place] [address] Phone:  [phone]  Fax:  [phone]     Pt unable to reach pharmacy, they will not answer.

## 2020-02-03 NOTE — Telephone Encounter (Signed)
Requested medication (s) are due for refill today: yes  Requested medication (s) are on the active medication list: yes  Last refill:  both last ordered 11/20/19  albuterol  rx expired 12/20/19  Future visit scheduled: no  Notes to clinic:  Called pt but unable to LM on Vm "mailbox full"   Requested Prescriptions  Pending Prescriptions Disp Refills   albuterol (VENTOLIN HFA) 108 (90 Base) MCG/ACT inhaler 6.7 g 0    Sig: INHALE 2 PUFFS INTO THE LUNGS EVERY 6 (SIX) HOURS AS NEEDED FOR WHEEZING OR SHORTNESS OF BREATH.      Pulmonology:  Beta Agonists Failed - 02/03/2020 10:27 AM      Failed - One inhaler should last at least one month. If the patient is requesting refills earlier, contact the patient to check for uncontrolled symptoms.      Failed - Valid encounter within last 12 months    Recent Outpatient Visits           1 year ago Essential hypertension   [place], Zelda W, NP   1 year ago Essential hypertension   [place], Zelda W, NP   1 year ago Essential hypertension   [place], Vernia Buff, NP   4 years ago [place], [place], MD   5 years ago Menorrhagia with regular cycle   [place], [place], MD                Fluticasone-Salmeterol (ADVAIR DISKUS) 100-50 MCG/DOSE AEPB 60 each 0    Sig: Inhale 1 puff into the lungs 2 (two) times daily.      Pulmonology:  Combination Products Failed - 02/03/2020 10:27 AM      Failed - Valid encounter within last 12 months    Recent Outpatient Visits           1 year ago Essential hypertension   [place], Vernia Buff, NP   1 year ago Essential hypertension   [place], Zelda W, NP   1 year ago Essential hypertension   [place], Vernia Buff, NP   4 years ago [place] Boykin Nearing, MD   5 years ago Menorrhagia with regular cycle   [facility] Boy[place] Nearing, MD

## 2020-02-04 ENCOUNTER — Other Ambulatory Visit: Payer: Self-pay | Admitting: Nurse Practitioner

## 2020-02-04 DIAGNOSIS — J410 Simple chronic bronchitis: Secondary | ICD-10-CM

## 2020-02-04 NOTE — Telephone Encounter (Signed)
Sent to wrong provider

## 2020-02-10 ENCOUNTER — Other Ambulatory Visit: Payer: Self-pay | Admitting: Nurse Practitioner

## 2020-02-10 DIAGNOSIS — J410 Simple chronic bronchitis: Secondary | ICD-10-CM

## 2020-02-10 DIAGNOSIS — I10 Essential (primary) hypertension: Secondary | ICD-10-CM

## 2020-02-20 ENCOUNTER — Other Ambulatory Visit: Payer: Self-pay | Admitting: Nurse Practitioner

## 2020-02-20 DIAGNOSIS — J410 Simple chronic bronchitis: Secondary | ICD-10-CM

## 2020-02-20 DIAGNOSIS — I10 Essential (primary) hypertension: Secondary | ICD-10-CM

## 2020-02-20 MED FILL — QUETIAPINE FUMARATE 50 MG T: 50 | 30 days supply | Qty: 30 | Fill #0

## 2020-02-20 MED FILL — HYDROCHLOROTHIAZIDE 25 MG T: 25 | 30 days supply | Qty: 30 | Fill #1

## 2020-02-20 MED FILL — PARoxetine HCL 20 MG TABS: 20 | 30 days supply | Qty: 30 | Fill #1

## 2020-02-20 NOTE — Telephone Encounter (Signed)
Requested  medications are  due for refill today yes  Requested medications are on the active medication list yes  Last refill 7/1  Last visit 01/2019  Future visit scheduled no  Notes to clinic Has already had a curtesy refill, no upcoming visit scheduled.

## 2020-07-07 ENCOUNTER — Encounter: Payer: Self-pay | Admitting: Nurse Practitioner

## 2020-07-07 ENCOUNTER — Other Ambulatory Visit: Payer: Self-pay

## 2020-07-07 ENCOUNTER — Other Ambulatory Visit: Payer: Self-pay | Admitting: Nurse Practitioner

## 2020-07-07 ENCOUNTER — Ambulatory Visit: Payer: Self-pay | Attending: Nurse Practitioner | Admitting: Nurse Practitioner

## 2020-07-07 VITALS — BP 146/76 | HR 72 | Temp 98.7°F | Ht 64.0 in | Wt 211.0 lb

## 2020-07-07 DIAGNOSIS — G4700 Insomnia, unspecified: Secondary | ICD-10-CM

## 2020-07-07 DIAGNOSIS — Z1231 Encounter for screening mammogram for malignant neoplasm of breast: Secondary | ICD-10-CM

## 2020-07-07 DIAGNOSIS — R7303 Prediabetes: Secondary | ICD-10-CM

## 2020-07-07 DIAGNOSIS — F419 Anxiety disorder, unspecified: Secondary | ICD-10-CM

## 2020-07-07 DIAGNOSIS — D72829 Elevated white blood cell count, unspecified: Secondary | ICD-10-CM

## 2020-07-07 DIAGNOSIS — E785 Hyperlipidemia, unspecified: Secondary | ICD-10-CM

## 2020-07-07 DIAGNOSIS — F32A Depression, unspecified: Secondary | ICD-10-CM

## 2020-07-07 DIAGNOSIS — R232 Flushing: Secondary | ICD-10-CM

## 2020-07-07 DIAGNOSIS — Z131 Encounter for screening for diabetes mellitus: Secondary | ICD-10-CM

## 2020-07-07 DIAGNOSIS — I10 Essential (primary) hypertension: Secondary | ICD-10-CM

## 2020-07-07 DIAGNOSIS — J410 Simple chronic bronchitis: Secondary | ICD-10-CM

## 2020-07-07 LAB — POCT GLYCOSYLATED HEMOGLOBIN (HGB A1C): Hemoglobin A1C: 5.5 % (ref 4.0–5.6)

## 2020-07-07 LAB — GLUCOSE, POCT (MANUAL RESULT ENTRY): POC Glucose: 156 mg/dl — AB (ref 70–99)

## 2020-07-07 MED ORDER — ALBUTEROL SULFATE HFA 108 (90 BASE) MCG/ACT IN AERS: INHALATION_SPRAY | RESPIRATORY_TRACT | 0 refills | Status: DC

## 2020-07-07 MED ORDER — PAROXETINE HCL 20 MG PO TABS: 20.0000 mg | ORAL_TABLET | Freq: Every day | ORAL | 0 refills | Status: AC

## 2020-07-07 MED ORDER — ATORVASTATIN CALCIUM 10 MG PO TABS: 10.0000 mg | ORAL_TABLET | Freq: Every day | ORAL | 2 refills | Status: DC

## 2020-07-07 MED ORDER — AMLODIPINE BESYLATE 5 MG PO TABS: 5.0000 mg | ORAL_TABLET | Freq: Every day | ORAL | 1 refills | Status: DC

## 2020-07-07 MED ORDER — HYDROCHLOROTHIAZIDE 25 MG PO TABS: 25.0000 mg | ORAL_TABLET | Freq: Every day | ORAL | 0 refills | Status: DC

## 2020-07-07 MED ORDER — HYDROXYZINE HCL 50 MG PO TABS: 50.0000 mg | ORAL_TABLET | Freq: Three times a day (TID) | ORAL | 2 refills | Status: AC | PRN

## 2020-07-07 MED ORDER — TRAZODONE HCL 100 MG PO TABS: 100.0000 mg | ORAL_TABLET | Freq: Every day | ORAL | 1 refills | Status: AC

## 2020-07-07 MED ORDER — FLUTICASONE-SALMETEROL 100-50 MCG/DOSE IN AEPB: 1.0000 | INHALATION_SPRAY | Freq: Two times a day (BID) | RESPIRATORY_TRACT | 6 refills | Status: AC

## 2020-07-07 MED FILL — hydrOXYzine HCL 50 MG TABS: 50 | 20 days supply | Qty: 60 | Fill #0

## 2020-07-07 MED FILL — PARoxetine HCL 20 MG TABS: 20 | 30 days supply | Qty: 30 | Fill #0

## 2020-07-07 MED FILL — TRAZODONE HCL 100 MG TABS: 100 | 30 days supply | Qty: 30 | Fill #0

## 2020-07-07 MED FILL — AMLODIPINE BESYLATE 5 MG TA: 5 | 30 days supply | Qty: 30 | Fill #0

## 2020-07-07 MED FILL — !VENTOLIN HFA INHALER: 108 (90 BAS | 25 days supply | Qty: 18 | Fill #0

## 2020-07-07 MED FILL — HYDROCHLOROTHIAZIDE 25 MG T: 25 | 30 days supply | Qty: 30 | Fill #0

## 2020-07-07 MED FILL — ?ATORVASTATIN 10 MG TABLET: 10 | 30 days supply | Qty: 30 | Fill #0

## 2020-07-07 MED FILL — ?FLUTICASONE-SALMETEROL 100: 100-50 | 30 days supply | Qty: 60 | Fill #0

## 2020-07-07 NOTE — Progress Notes (Signed)
Assessment & Plan:  Terri Hogan was seen today for medication refill.  Diagnoses and all orders for this visit:  Essential hypertension -     CMP14+EGFR -     amLODipine (NORVASC) 5 MG tablet; Take 1 tablet (5 mg total) by mouth daily. -     hydrochlorothiazide (HYDRODIURIL) 25 MG tablet; Take 1 tablet (25 mg total) by mouth daily. Continue all antihypertensives as prescribed.  Remember to bring in your blood pressure log with you for your follow up appointment.  DASH/Mediterranean Diets are healthier choices for HTN.    Prediabetes -     Glucose (CBG) -     HgB A1c Continue blood sugar control as discussed in office today, low carbohydrate diet, and regular physical exercise as tolerated, 150 minutes per week (30 min each day, 5 days per week, or 50 min 3 days per week).    Dyslipidemia, goal LDL below 70 -     Lipid panel -     atorvastatin (LIPITOR) 10 MG tablet; Take 1 tablet (10 mg total) by mouth daily.  Breast cancer screening by mammogram -     MM 3D SCREEN BREAST BILATERAL; Future  Simple chronic bronchitis (HCC) -     albuterol (VENTOLIN HFA) 108 (90 Base) MCG/ACT inhaler; INHALE 2 PUFFS INTO THE LUNGS EVERY 6 (SIX) HOURS AS NEEDED FOR WHEEZING OR SHORTNESS OF BREATH. -     Fluticasone-Salmeterol (ADVAIR DISKUS) 100-50 MCG/DOSE AEPB; Inhale 1 puff into the lungs 2 (two) times daily. NEEDS PASS Encouraged to quit smoking.   Anxiety and depression -     PARoxetine (PAXIL) 20 MG tablet; Take 1 tablet (20 mg total) by mouth daily. -     hydrOXYzine (ATARAX/VISTARIL) 50 MG tablet; Take 1 tablet (50 mg total) by mouth 3 (three) times daily as needed for anxiety.  Insomnia, unspecified type -     traZODone (DESYREL) 100 MG tablet; Take 1 tablet (100 mg total) by mouth at bedtime.  Hot flashes -     PARoxetine (PAXIL) 20 MG tablet; Take 1 tablet (20 mg total) by mouth daily.    Patient has been counseled on age-appropriate routine health concerns for screening and  prevention. These are reviewed and up-to-date. Referrals have been placed accordingly. Immunizations are up-to-date or declined.    Subjective:   Chief Complaint  Patient presents with  . Medication Refill    Patient is here for medication refill.    HPI Terri Hogan 55 y.o. female presents to office today for follow up. She has a past medical history of Anemia, GERD, Hypertension, Insomnia, Menorrhagia, Prediabetes  I have not seen her in this office since 01-2019. She was referred to [facility] for mammogram in 2020 and still has not completed. Referred again today. She has been out of most of her medications for several months.    Essential Hypertension Not well controlled. Again she has been out of her medications for quite some time. Currently prescribed amlodipine 5 mg daily. Will refill for her today.  BP Readings from Last 3 Encounters:  07/07/20 (!) 146/76  12/10/19 (!) 131/95  08/11/19 111/63   Prediabetes Diet controlled only at this time. LDL not at goal. Atorvastatin 10 mg daily refilled.  Lab Results  Component Value Date   HGBA1C 5.5 07/07/2020   Lab Results  Component Value Date   HGBA1C 6.1 (H) 01/31/2019   Lab Results  Component Value Date   LDLCALC 190 (H) 01/31/2019   The 10-year ASCVD  risk score Mikey Bussing DC Brooke Bonito., et al., 2013) is: 9.9%   Values used to calculate the score:     Age: 75 years     Sex: Female     Is Non-Hispanic African American: No     Diabetic: No     Tobacco smoker: Yes     Systolic Blood Pressure: 695 mmHg     Is BP treated: Yes     HDL Cholesterol: 59 mg/dL     Total Cholesterol: 289 mg/dL   Tobacco Dependence Has been using ventolin more than her advair. I have instructed her that the ventolin is rescue use only. Advair should be used daily.  Encouraged her to stop smoking.    Anxiety and Depression Paxil and hydroxyzine have been effective in the past. Will refill both today. She does have associated insomnia and was previously  taking seroquel which was ineffective. Will try trazodone today. Depression screen [facility] 2/9 07/07/2020 01/31/2019 03/27/2018 03/06/2018 07/02/2015  Decreased Interest '3 3 1 3 ' 0  Down, Depressed, Hopeless 3 2 0 3 0  PHQ - 2 Score '6 5 1 6 ' 0  Altered sleeping '3 3 3 3 1  ' Tired, decreased energy '3 3 1 3 1  ' Change in appetite '2 3 2 3 1  ' Feeling bad or failure about yourself  3 0 0 1 0  Trouble concentrating '3 3 1 3 1  ' Moving slowly or fidgety/restless 0 0 0 1 0  Suicidal thoughts 0 0 0 0 0  PHQ-9 Score '20 17 8 20 4   ' GAD 7 : Generalized Anxiety Score 07/07/2020 01/31/2019 03/27/2018 03/06/2018  Nervous, Anxious, on Edge '3 3 2 3  ' Control/stop worrying '3 3 3 3  ' Worry too much - different things '3 3 3 3  ' Trouble relaxing '3 3 3 3  ' Restless '3 3 3 3  ' Easily annoyed or irritable '3 3 2 3  ' Afraid - awful might happen '1 2 1 2  ' Total GAD 7 Score '19 20 17 20    ' Review of Systems  Constitutional: Negative for fever, malaise/fatigue and weight loss.  HENT: Negative.  Negative for nosebleeds.   Eyes: Negative.  Negative for blurred vision, double vision and photophobia.  Respiratory: Negative.  Negative for cough, sputum production, shortness of breath and wheezing.   Cardiovascular: Negative.  Negative for chest pain, palpitations and leg swelling.  Gastrointestinal: Negative.  Negative for heartburn, nausea and vomiting.  Musculoskeletal: Negative.  Negative for myalgias.  Neurological: Negative.  Negative for dizziness, focal weakness, seizures and headaches.  Psychiatric/Behavioral: Positive for depression. Negative for suicidal ideas. The patient is nervous/anxious and has insomnia.     Past Medical History:  Diagnosis Date  . Anemia   . GERD (gastroesophageal reflux disease)    otc prn  . Hypertension   . Insomnia   . Menorrhagia   . PONV (postoperative nausea and vomiting)   . SVD (spontaneous vaginal delivery)    x 2    Past Surgical History:  Procedure Laterality Date  . CARPAL TUNNEL  RELEASE Right   . CESAREAN SECTION     x 1  . CHOLECYSTECTOMY    . DILITATION & CURRETTAGE/HYSTROSCOPY WITH NOVASURE ABLATION N/A 10/22/2015   Procedure: DILATATION & CURETTAGE WITH NOVASURE ABLATION;  Surgeon: Emily Filbert, MD;  Location: [facility];  Service: Gynecology;  Laterality: N/A;  . TUBAL LIGATION      Family History  Problem Relation Age of Onset  . Diabetes Mother   .  Cancer Father        cirrhosis    Social History Reviewed with no changes to be made today.   Outpatient Medications Prior to Visit  Medication Sig Dispense Refill  . albuterol (VENTOLIN HFA) 108 (90 Base) MCG/ACT inhaler INHALE 2 PUFFS INTO THE LUNGS EVERY 6 (SIX) HOURS AS NEEDED FOR WHEEZING OR SHORTNESS OF BREATH. (Patient not taking: Reported on 07/07/2020) 6.7 g 0  . amLODipine (NORVASC) 5 MG tablet Take 1 tablet (5 mg total) by mouth daily. (Patient not taking: Reported on 07/07/2020) 30 tablet 0  . atorvastatin (LIPITOR) 10 MG tablet Take 1 tablet (10 mg total) by mouth daily. (Patient not taking: Reported on 07/07/2020) 30 tablet 0  . Fluticasone-Salmeterol (ADVAIR DISKUS) 100-50 MCG/DOSE AEPB Inhale 1 puff into the lungs 2 (two) times daily. 60 each 0  . hydrochlorothiazide (HYDRODIURIL) 25 MG tablet Take 1 tablet (25 mg total) by mouth daily. (Patient not taking: Reported on 07/07/2020) 90 tablet 0  . hydrOXYzine (ATARAX/VISTARIL) 50 MG tablet Take 1 tablet (50 mg total) by mouth 3 (three) times daily as needed for anxiety. (Patient not taking: Reported on 07/07/2020) 60 tablet 1  . PARoxetine (PAXIL) 20 MG tablet Take 1 tablet (20 mg total) by mouth daily. 90 tablet 0  . QUEtiapine (SEROQUEL) 50 MG tablet Take 1 tablet (50 mg total) by mouth at bedtime. (Patient not taking: Reported on 07/07/2020) 90 tablet 0   No facility-administered medications prior to visit.    Allergies  Allergen Reactions  . Codeine Hives, Itching and Nausea And Vomiting       Objective:    BP (!) 146/76 (BP Location: Left Arm,  Patient Position: Sitting, Cuff Size: Large)   Pulse 72   Temp 98.7 F (37.1 C) (Oral)   Ht '5\' 4"'  (1.626 m)   Wt 211 lb (95.7 kg)   SpO2 97%   BMI 36.22 kg/m  Wt Readings from Last 3 Encounters:  07/07/20 211 lb (95.7 kg)  12/10/19 202 lb (91.6 kg)  01/31/19 206 lb (93.4 kg)    Physical Exam Vitals and nursing note reviewed.  Constitutional:      Appearance: She is well-developed and well-nourished.  HENT:     Head: Normocephalic and atraumatic.  Eyes:     Extraocular Movements: EOM normal.  Cardiovascular:     Rate and Rhythm: Normal rate and regular rhythm.     Pulses: Intact distal pulses.     Heart sounds: Normal heart sounds. No murmur heard. No friction rub. No gallop.   Pulmonary:     Effort: Pulmonary effort is normal. No tachypnea or respiratory distress.     Breath sounds: Normal breath sounds. No decreased breath sounds, wheezing, rhonchi or rales.  Chest:     Chest wall: No tenderness.  Abdominal:     General: Bowel sounds are normal.     Palpations: Abdomen is soft.  Musculoskeletal:        General: No edema. Normal range of motion.     Cervical back: Normal range of motion.  Skin:    General: Skin is warm and dry.  Neurological:     Mental Status: She is alert and oriented to person, place, and time.     Coordination: Coordination normal.  Psychiatric:        Mood and Affect: Mood and affect normal.        Behavior: Behavior normal. Behavior is cooperative.        Thought Content: Thought content  normal.        Judgment: Judgment normal.          Patient has been counseled extensively about nutrition and exercise as well as the importance of adherence with medications and regular follow-up. The patient was given clear instructions to go to ER or return to medical center if symptoms don't improve, worsen or new problems develop. The patient verbalized understanding.   Follow-up: Return for PAP SMEAR.   Gildardo Pounds, FNP-BC [facility] [place], [place]   07/07/2020, 11:01 AM

## 2020-07-07 NOTE — Patient Instructions (Signed)
    MyChart Username [id number]

## 2020-07-08 LAB — CBC
Hematocrit: 44.8 % (ref 34.0–46.6)
Hemoglobin: 15.5 g/dL (ref 11.1–15.9)
MCH: 32 pg (ref 26.6–33.0)
MCHC: 34.6 g/dL (ref 31.5–35.7)
MCV: 92 fL (ref 79–97)
Platelets: 322 x10E3/uL (ref 150–450)
RBC: 4.85 x10E6/uL (ref 3.77–5.28)
RDW: 13.1 % (ref 11.7–15.4)
WBC: 7.9 x10E3/uL (ref 3.4–10.8)

## 2020-07-08 LAB — CMP14+EGFR
ALT: 15 IU/L (ref 0–32)
AST: 15 IU/L (ref 0–40)
Albumin/Globulin Ratio: 2.1 (ref 1.2–2.2)
Albumin: 4.6 g/dL (ref 3.8–4.9)
Alkaline Phosphatase: 86 IU/L (ref 44–121)
BUN/Creatinine Ratio: 20 (ref 9–23)
BUN: 15 mg/dL (ref 6–24)
Bilirubin Total: 0.2 mg/dL (ref 0.0–1.2)
CO2: 20 mmol/L (ref 20–29)
Calcium: 9.6 mg/dL (ref 8.7–10.2)
Chloride: 101 mmol/L (ref 96–106)
Creatinine, Ser: 0.75 mg/dL (ref 0.57–1.00)
GFR calc Af Amer: 106 mL/min/{1.73_m2} (ref >59)
GFR calc non Af Amer: 92 mL/min/{1.73_m2} (ref >59)
Globulin, Total: 2.2 g/dL (ref 1.5–4.5)
Glucose: 122 mg/dL — ABNORMAL HIGH (ref 65–99)
Potassium: 4.8 mmol/L (ref 3.5–5.2)
Sodium: 139 mmol/L (ref 134–144)
Total Protein: 6.8 g/dL (ref 6.0–8.5)

## 2020-07-08 LAB — LIPID PANEL
Chol/HDL Ratio: 4.2 ratio (ref 0.0–4.4)
Cholesterol, Total: 279 mg/dL — ABNORMAL HIGH (ref 100–199)
HDL: 67 mg/dL
LDL Chol Calc (NIH): 183 mg/dL — ABNORMAL HIGH (ref 0–99)
Triglycerides: 158 mg/dL — ABNORMAL HIGH (ref 0–149)
VLDL Cholesterol Cal: 29 mg/dL (ref 5–40)

## 2020-09-24 ENCOUNTER — Other Ambulatory Visit: Payer: Self-pay

## 2020-11-11 ENCOUNTER — Other Ambulatory Visit: Payer: Self-pay

## 2020-11-11 ENCOUNTER — Other Ambulatory Visit: Payer: Self-pay | Admitting: Nurse Practitioner

## 2020-11-11 DIAGNOSIS — J410 Simple chronic bronchitis: Secondary | ICD-10-CM

## 2020-11-11 MED FILL — Fluticasone-Salmeterol Aer Powder BA 100-50 MCG/ACT: RESPIRATORY_TRACT | 30 days supply | Qty: 60 | Fill #0 | Status: AC

## 2020-11-11 NOTE — Telephone Encounter (Signed)
  Notes to clinic: script last filled on 07/07/2020 Review for continued use and refill    Requested Prescriptions  Pending Prescriptions Disp Refills   albuterol (VENTOLIN HFA) 108 (90 Base) MCG/ACT inhaler 6.7 g 0    Sig: INHALE 2 PUFFS INTO THE LUNGS EVERY 6 (SIX) HOURS AS NEEDED FOR WHEEZING OR SHORTNESS OF BREATH.      Pulmonology:  Beta Agonists Failed - 11/11/2020  2:39 PM      Failed - One inhaler should last at least one month. If the patient is requesting refills earlier, contact the patient to check for uncontrolled symptoms.      Passed - Valid encounter within last 12 months    Recent Outpatient Visits           4 months ago Essential hypertension   [place], Vernia Buff, NP   1 year ago Essential hypertension   [place], Vernia Buff, NP   2 years ago Essential hypertension   [place], Vernia Buff, NP   2 years ago Essential hypertension   [place], Vernia Buff, NP   5 years ago Healthcare maintenance   [facility] Boykin Nearing, MD

## 2020-11-12 ENCOUNTER — Other Ambulatory Visit: Payer: Self-pay

## 2020-11-12 MED ORDER — ALBUTEROL SULFATE HFA 108 (90 BASE) MCG/ACT IN AERS
INHALATION_SPRAY | RESPIRATORY_TRACT | 0 refills | Status: AC
  Filled 2020-11-12: qty 6.7, 25d supply, fill #0
  Filled 2020-11-12: qty 18, 25d supply, fill #0

## 2020-11-18 ENCOUNTER — Other Ambulatory Visit: Payer: Self-pay

## 2020-11-25 ENCOUNTER — Other Ambulatory Visit: Payer: Self-pay

## 2021-01-05 ENCOUNTER — Other Ambulatory Visit: Payer: Self-pay

## 2021-01-07 ENCOUNTER — Other Ambulatory Visit: Payer: Self-pay

## 2021-06-27 ENCOUNTER — Emergency Department (HOSPITAL_COMMUNITY): Payer: Self-pay

## 2021-06-27 ENCOUNTER — Telehealth (HOSPITAL_COMMUNITY): Payer: Self-pay | Admitting: Emergency Medicine

## 2021-06-27 ENCOUNTER — Emergency Department (HOSPITAL_COMMUNITY)
Admission: EM | Admit: 2021-06-27 | Discharge: 2021-06-27 | Disposition: A | Discharge status: Home / Self Care | Payer: Self-pay | Attending: Emergency Medicine | Admitting: Emergency Medicine

## 2021-06-27 ENCOUNTER — Encounter (HOSPITAL_COMMUNITY): Payer: Self-pay

## 2021-06-27 ENCOUNTER — Other Ambulatory Visit: Payer: Self-pay

## 2021-06-27 DIAGNOSIS — N200 Calculus of kidney: Secondary | ICD-10-CM | POA: Insufficient documentation

## 2021-06-27 DIAGNOSIS — N12 Tubulo-interstitial nephritis, not specified as acute or chronic: Secondary | ICD-10-CM

## 2021-06-27 DIAGNOSIS — D72829 Elevated white blood cell count, unspecified: Secondary | ICD-10-CM | POA: Insufficient documentation

## 2021-06-27 DIAGNOSIS — M25522 Pain in left elbow: Secondary | ICD-10-CM | POA: Insufficient documentation

## 2021-06-27 LAB — URINALYSIS, ROUTINE W REFLEX MICROSCOPIC
Bilirubin Urine: NEGATIVE
Glucose, UA: 100 mg/dL — AB
Ketones, ur: NEGATIVE mg/dL
Nitrite: POSITIVE — AB
Protein, ur: 100 mg/dL — AB
Specific Gravity, Urine: 1.01 (ref 1.005–1.030)
pH: 6.5 (ref 5.0–8.0)

## 2021-06-27 LAB — PREGNANCY, URINE: Preg Test, Ur: NEGATIVE

## 2021-06-27 LAB — URINALYSIS, MICROSCOPIC (REFLEX)
RBC / HPF: >50 RBC/hpf (ref 0–5)
WBC, UA: >50 WBC/hpf (ref 0–5)

## 2021-06-27 MED ORDER — KETOROLAC TROMETHAMINE 15 MG/ML IJ SOLN: 15.0000 mg | Freq: Once | INTRAMUSCULAR | Status: DC

## 2021-06-27 MED ORDER — OXYCODONE-ACETAMINOPHEN 5-325 MG PO TABS
1.0000 | ORAL_TABLET | Freq: Once | ORAL | Status: AC
  Administered 2021-06-27: 1 via ORAL
  Filled 2021-06-27: qty 1

## 2021-06-27 MED ORDER — CIPROFLOXACIN HCL 500 MG PO TABS
500.0000 mg | ORAL_TABLET | Freq: Two times a day (BID) | ORAL | 0 refills | Status: AC
  Filled 2021-06-27: qty 14, 7d supply, fill #0

## 2021-06-27 MED ORDER — OXYCODONE-ACETAMINOPHEN 5-325 MG PO TABS: 1.0000 | ORAL_TABLET | Freq: Four times a day (QID) | ORAL | 0 refills | Status: DC | PRN

## 2021-06-27 MED ORDER — KETOROLAC TROMETHAMINE 15 MG/ML IJ SOLN
15.0000 mg | Freq: Once | INTRAMUSCULAR | Status: AC
  Administered 2021-06-27: 15 mg via INTRAMUSCULAR
  Filled 2021-06-27: qty 1

## 2021-06-27 MED ORDER — OXYCODONE-ACETAMINOPHEN 5-325 MG PO TABS
1.0000 | ORAL_TABLET | Freq: Four times a day (QID) | ORAL | 0 refills | Status: DC | PRN
  Filled 2021-06-27: qty 6, 2d supply, fill #0

## 2021-06-27 NOTE — ED Provider Triage Note (Signed)
Emergency Medicine Provider Triage Evaluation Note  Terri Hogan , a 56 y.o. female  was evaluated in triage.  Pt complains of hematuria.  She denies any menstrual bleeding.   She reports clots in the blood. No new back pain.  No history of kidney stones.  She has chronic back pain that is unchanged.  No fevers.   No thinners\  She reports left elbow pain that has been ongoing for four months   Physical Exam  BP (!) 175/86 (BP Location: Left Arm)    Pulse 92    Temp 97.7 F (36.5 C) (Oral)    Ht 5\' 4"  (1.626 m)    Wt 96 kg    SpO2 99%    BMI 36.33 kg/m  Gen:   Awake, no distress   Resp:  Normal effort  MSK:   Moves extremities without difficulty  Other:  Normal speech   Medical Decision Making  Medically screening exam initiated at 12:22 PM.  Appropriate orders placed.  Terri Hogan was informed that the remainder of the evaluation will be completed by another provider, this initial triage assessment does not replace that evaluation, and the importance of remaining in the ED until their evaluation is complete.     Lorin Glass, [place] 06/27/21 1227

## 2021-06-27 NOTE — ED Provider Notes (Signed)
[facility] EMERGENCY DEPARTMENT Provider Note   CSN: [id number] Arrival date & time: 06/27/21  1145     History  Chief Complaint  Patient presents with   Hematuria    Terri Hogan is a 56 y.o. female who presents to the emergency department complaining of hematuria starting today.  She reports seeing clots in her urine.  She also has bilateral flank pain.  No history of kidney stones.  She is unsure if her back pain is like her chronic pain, or if it is worsening today.  No fevers, vaginal bleeding, abdominal pain, nausea, vomiting.  She also is complaining of left elbow pain has been ongoing for 4 months.   Hematuria Pertinent negatives include no abdominal pain.      Home Medications Prior to Admission medications   Medication Sig Start Date End Date Taking? Authorizing Provider  ciprofloxacin (CIPRO) 500 MG tablet Take 1 tablet (500 mg total) by mouth every 12 (twelve) hours for 7 days. 06/27/21 07/04/21 Yes Prabhjot Maddux T, PA-C  oxyCODONE-acetaminophen (PERCOCET/ROXICET) 5-325 MG tablet Take 1 tablet by mouth every 6 (six) hours as needed for severe pain. 06/27/21  Yes Noell Shular T, PA-C  albuterol (VENTOLIN HFA) 108 (90 Base) MCG/ACT inhaler INHALE 2 PUFFS INTO THE LUNGS EVERY 6 (SIX) HOURS AS NEEDED FOR WHEEZING OR SHORTNESS OF BREATH. 11/12/20   Charlott Rakes, MD  amLODipine (NORVASC) 5 MG tablet Take 1 tablet (5 mg total) by mouth daily. 07/07/20 10/05/20  Gildardo Pounds, NP  amLODipine (NORVASC) 5 MG tablet TAKE 1 TABLET (5 MG TOTAL) BY MOUTH DAILY. 07/07/20 07/07/21  Gildardo Pounds, NP  atorvastatin (LIPITOR) 10 MG tablet Take 1 tablet (10 mg total) by mouth daily. 07/07/20 10/05/20  Gildardo Pounds, NP  atorvastatin (LIPITOR) 10 MG tablet TAKE 1 TABLET (10 MG TOTAL) BY MOUTH DAILY. 07/07/20 07/07/21  Gildardo Pounds, NP  Fluticasone-Salmeterol (ADVAIR DISKUS) 100-50 MCG/DOSE AEPB Inhale 1 puff into the lungs 2 (two) times daily. NEEDS PASS 07/07/20  08/06/20  Gildardo Pounds, NP  fluticasone-salmeterol (ADVAIR) 100-50 MCG/ACT AEPB INHALE 1 PUFF INTO THE LUNGS 2 (TWO) TIMES DAILY. NEEDS PASS 07/07/20 07/07/21  Gildardo Pounds, NP  hydrochlorothiazide (HYDRODIURIL) 25 MG tablet Take 1 tablet (25 mg total) by mouth daily. 07/07/20   Gildardo Pounds, NP  hydrochlorothiazide (HYDRODIURIL) 25 MG tablet TAKE 1 TABLET (25 MG TOTAL) BY MOUTH DAILY. 07/07/20 07/07/21  Gildardo Pounds, NP  hydrOXYzine (ATARAX/VISTARIL) 50 MG tablet Take 1 tablet (50 mg total) by mouth 3 (three) times daily as needed for anxiety. 07/07/20   Gildardo Pounds, NP  hydrOXYzine (ATARAX/VISTARIL) 50 MG tablet TAKE 1 TABLET (50 MG TOTAL) BY MOUTH 3 (THREE) TIMES DAILY AS NEEDED FOR ANXIETY. 07/07/20 07/07/21  Gildardo Pounds, NP  PARoxetine (PAXIL) 20 MG tablet Take 1 tablet (20 mg total) by mouth daily. 07/07/20 10/05/20  Gildardo Pounds, NP  PARoxetine (PAXIL) 20 MG tablet TAKE 1 TABLET (20 MG TOTAL) BY MOUTH DAILY. 07/07/20 07/07/21  Gildardo Pounds, NP  traZODone (DESYREL) 100 MG tablet Take 1 tablet (100 mg total) by mouth at bedtime. 07/07/20 10/05/20  Gildardo Pounds, NP  traZODone (DESYREL) 100 MG tablet TAKE 1 TABLET (100 MG TOTAL) BY MOUTH AT BEDTIME. 07/07/20 07/07/21  Gildardo Pounds, NP      Allergies    Codeine    Review of Systems   Review of Systems  Constitutional:  Negative for chills and fever.  Gastrointestinal:  Negative for abdominal pain, constipation, diarrhea, nausea and vomiting.  Genitourinary:  Positive for flank pain and hematuria. Negative for dysuria, frequency, pelvic pain, urgency, vaginal bleeding and vaginal discharge.  Musculoskeletal:  Positive for back pain.  All other systems reviewed and are negative.  Physical Exam Updated Vital Signs BP (!) 175/86 (BP Location: Left Arm)    Pulse 92    Temp 97.7 F (36.5 C) (Oral)    Ht 5\' 4"  (1.626 m)    Wt 96 kg    SpO2 99%    BMI 36.33 kg/m  Physical Exam Vitals and nursing note reviewed.   Constitutional:      Appearance: Normal appearance.  HENT:     Head: Normocephalic and atraumatic.  Eyes:     Conjunctiva/sclera: Conjunctivae normal.  Cardiovascular:     Rate and Rhythm: Normal rate and regular rhythm.  Pulmonary:     Effort: Pulmonary effort is normal. No respiratory distress.     Breath sounds: Normal breath sounds.  Abdominal:     General: There is no distension.     Palpations: Abdomen is soft.     Tenderness: There is no abdominal tenderness. There is no right CVA tenderness, left CVA tenderness, guarding or rebound.  Skin:    General: Skin is warm and dry.  Neurological:     General: No focal deficit present.     Mental Status: She is alert.    ED Results / Procedures / Treatments   Labs (all labs ordered are listed, but only abnormal results are displayed) Labs Reviewed  URINALYSIS, ROUTINE W REFLEX MICROSCOPIC - Abnormal; Notable for the following components:      Result Value   APPearance HAZY (*)    Glucose, UA 100 (*)    Hgb urine dipstick LARGE (*)    Protein, ur 100 (*)    Nitrite POSITIVE (*)    Leukocytes,Ua SMALL (*)    All other components within normal limits  URINALYSIS, MICROSCOPIC (REFLEX) - Abnormal; Notable for the following components:   Bacteria, UA FEW (*)    All other components within normal limits  PREGNANCY, URINE    EKG None  Radiology DG Elbow Complete Left  Result Date: 06/27/2021 CLINICAL DATA:  Left elbow pain EXAM: LEFT ELBOW - COMPLETE 3+ VIEW COMPARISON:  None. FINDINGS: No evidence of acute fracture or joint effusion. No significant arthropathy. Soft tissues are unremarkable radiographically. IMPRESSION: Negative left elbow radiographs. Electronically Signed   By: Maurine Simmering M.D.   On: 06/27/2021 12:46   CT Renal Stone Study  Result Date: 06/27/2021 CLINICAL DATA:  Hematuria since this morning. EXAM: CT ABDOMEN AND PELVIS WITHOUT CONTRAST TECHNIQUE: Multidetector CT imaging of the abdomen and pelvis was  performed following the standard protocol without IV contrast. RADIATION DOSE REDUCTION: This exam was performed according to the departmental dose-optimization program which includes automated exposure control, adjustment of the mA and/or kV according to patient size and/or use of iterative reconstruction technique. COMPARISON:  December 12, 2017 FINDINGS: Lower chest: Left lower lobe calcified granuloma. No acute abnormality. Hepatobiliary: Unremarkable noncontrast appearance of the hepatic parenchyma. Gallbladder surgically absent. No biliary ductal dilation. Pancreas: No pancreatic ductal dilation or evidence of acute inflammation. Spleen: Normal in size without focal abnormality. Adrenals/Urinary Tract: Bilateral adrenal glands are within normal limits. No hydronephrosis. Punctate nonobstructive right upper pole renal calculus. No obstructive ureteral or bladder calculi identified. No contour deforming renal mass. Urinary bladder is nondistended limiting evaluation. Stomach/Bowel: No enteric contrast  was administered. Stomach is unremarkable for degree of distension. No pathologic dilation of small or large bowel. The appendix and terminal ileum appear normal. Mild submucosal fatty infiltration of the ascending colonic wall. No evidence of acute bowel inflammation. Vascular/Lymphatic: Scattered aortic atherosclerosis. No abdominal aortic aneurysm. No pathologically enlarged abdominal or pelvic lymph nodes. Reproductive: Uterus and bilateral adnexa are unremarkable. Other: No abdominal wall hernia or abnormality. No abdominopelvic ascites. Musculoskeletal: Mild thoracolumbar spondylosis. No acute osseous abnormality. IMPRESSION: 1. Punctate nonobstructive right upper pole renal calculus. No obstructive ureteral or bladder calculi identified. 2. Mild submucosal fatty infiltration of the ascending colonic wall, which can be seen in the setting of chronic inflammation. Electronically Signed   By: Dahlia Bailiff M.D.    On: 06/27/2021 14:28    Procedures Procedures    Medications Ordered in ED Medications  oxyCODONE-acetaminophen (PERCOCET/ROXICET) 5-325 MG per tablet 1 tablet (has no administration in time range)  ketorolac (TORADOL) 15 MG/ML injection 15 mg (15 mg Intramuscular Given 06/27/21 1512)    ED Course/ Medical Decision Making/ A&P                           Medical Decision Making Amount and/or Complexity of Data Reviewed Radiology: ordered.  Risk Prescription drug management.   Patient is a 56 year old female presents the emergency department complaining of 1 day of hematuria, and bilateral lower back pain.  On my exam patient is afebrile, not tachycardic, not hypoxic, no acute distress.  She has no significant CVA tenderness.  Abdomen is soft, nontender nondistended.  She states that she has seen blood in her urine multiple times today, with intermittent clots.  She has no history of kidney stones.  She denies increased urinary frequency, urgency, or dysuria.  She denies pelvic pain or vaginal bleeding.  Urinalysis significant for large hemoglobin, positive nitrates, and small leukocytes.  In the setting of flank pain and hematuria, ordered a CT renal stone study to evaluate for hemorrhagic acute cystitis versus infected nephrolithiasis versus pyelonephritis.  CT renal stone study showed nonobstructive right renal calculus, no obstructive ureteral or bladder calculi.  I agree with the radiologist interpretation. Patient was also complaining of left chronic elbow pain, x-ray performed of this showed no acute fractures or dislocations.  Patient given 1 dose of Toradol and on reevaluation she states that her symptoms have remained the same.  Patient given 1 dose of Percocet, and states her pain is improved.  After my evaluation interpretation of labs and imaging, I do not believe the patient is requiring admission or inpatient treatment for her symptoms.  We will treat pyelonephritis with  antibiotics and pain medication in the outpatient setting.  Have given her the contact information for the urologist to follow-up this week.  We discussed reasons to return to the emergency department, and patient is agreeable to the plan.  Final Clinical Impression(s) / ED Diagnoses Final diagnoses:  Pyelonephritis    Rx / DC Orders ED Discharge Orders          Ordered    ciprofloxacin (CIPRO) 500 MG tablet  Every 12 hours        06/27/21 1557    oxyCODONE-acetaminophen (PERCOCET/ROXICET) 5-325 MG tablet  Every 6 hours PRN        06/27/21 1557           Portions of this report may have been transcribed using voice recognition software. Every effort was made to ensure accuracy; however,  inadvertent computerized transcription errors may be present.    Estill Cotta 06/27/21 1559    Sherwood Gambler, MD 06/28/21 [phone]

## 2021-06-27 NOTE — Telephone Encounter (Signed)
I received a message from the pharmacy that they were not able to fill the patient's prescription for Percocet, as they are not filling controlled substances at this time.  I called the patient and she requested that I send the prescription to the [facility] on [address].  I will attempt to do so.  Annalina Needles PA-C

## 2021-06-27 NOTE — ED Triage Notes (Signed)
Pt arrives POV for eval of hematuria, onset this AM. Pt denies dysuria, abd pain, pelvic pain, fevers\/chills

## 2021-06-27 NOTE — Discharge Instructions (Addendum)
You were seen in the emergency department for blood in your urine.  As we discussed you have a kidney infection.  We will treat this with antibiotics.  I'd like you to take this twice daily for the next 7 days.  I am also prescribing you pain medicine that you can take every 6 hours as needed for pain.  This medicine can make you sleepy, so do not drink alcohol or drive while taking this medicine.  I am giving you the contact information for the urologist to call and make an appointment if your symptoms persist.  Continue to monitor how you are doing and return to the emergency department for any new or worsening symptoms.

## 2021-11-21 ENCOUNTER — Other Ambulatory Visit: Payer: Self-pay

## 2022-03-25 IMAGING — CT CT RENAL STONE PROTOCOL
2 of 4 series · 15 of 46 positions shown, 17 images · non-contrast
Comparison: December 12, 2017

CLINICAL DATA: Hematuria since this morning.



[Series 3: ap without · axial · non-contrast · 0.77mm/px · z∈[-1190,-760]mm · 12 of 98 slices shown, 14 images]
[im 6/98  soft-tissue]
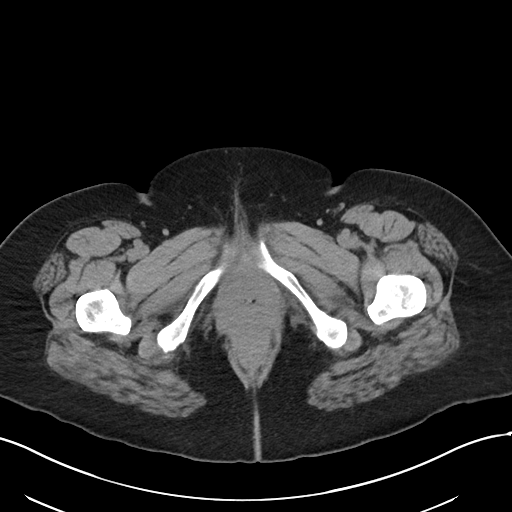
[im 6/98  bone]
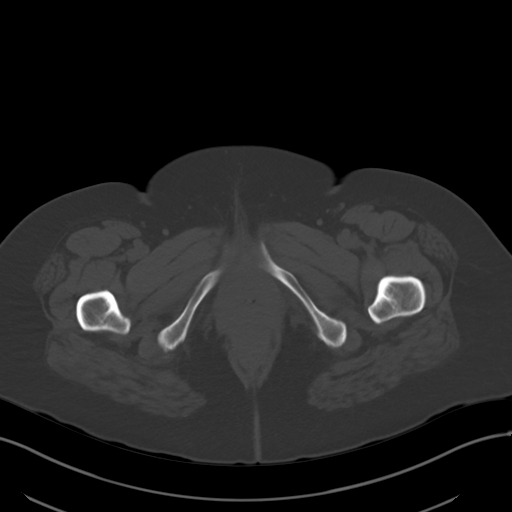
[im 16/98  soft-tissue]
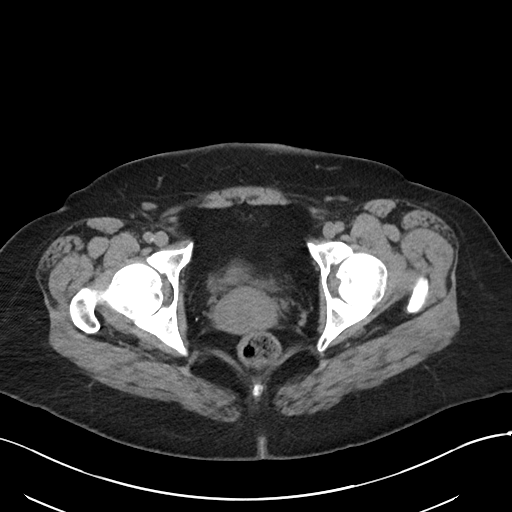
[im 21/98  soft-tissue]
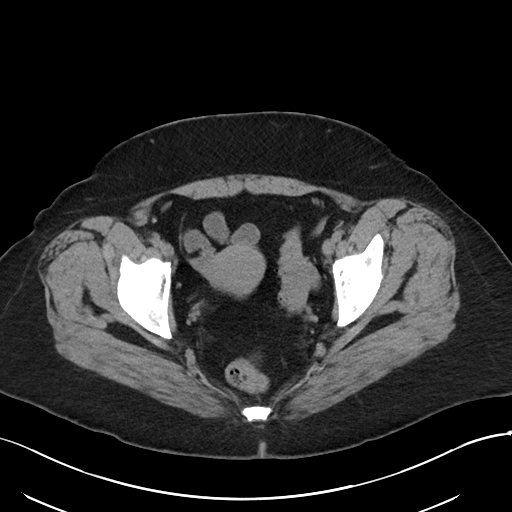
[im 31/98  soft-tissue]
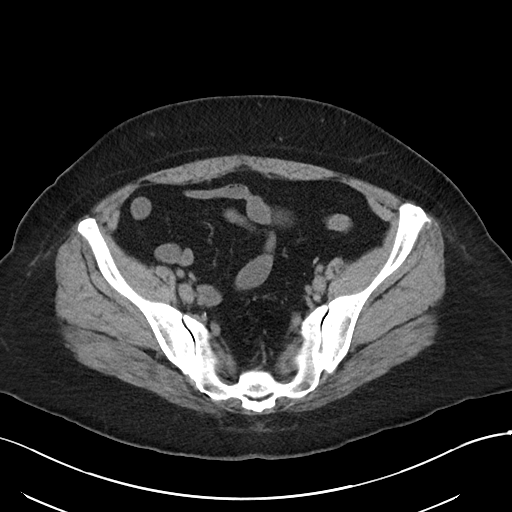
[im 36/98  soft-tissue]
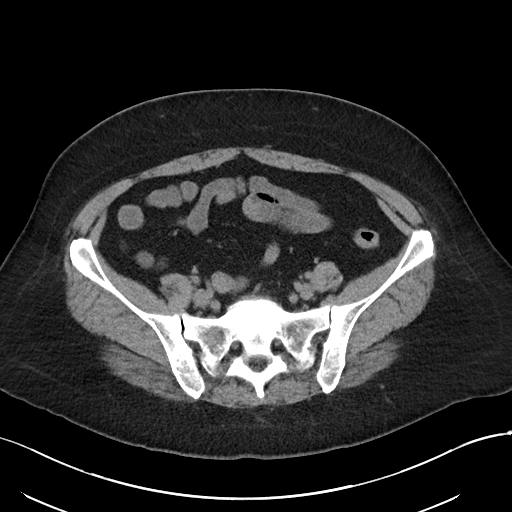
[im 46/98  soft-tissue]
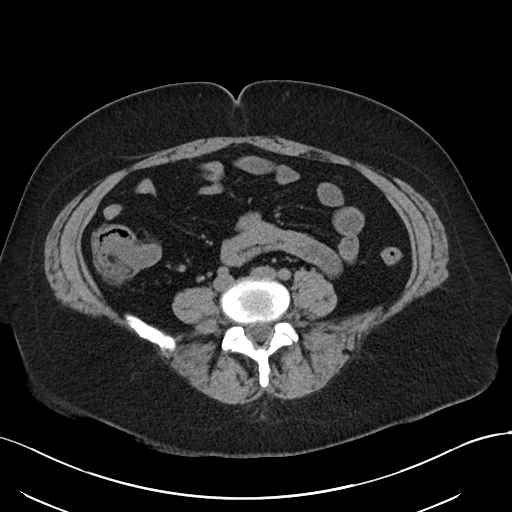
[im 52/98  soft-tissue]
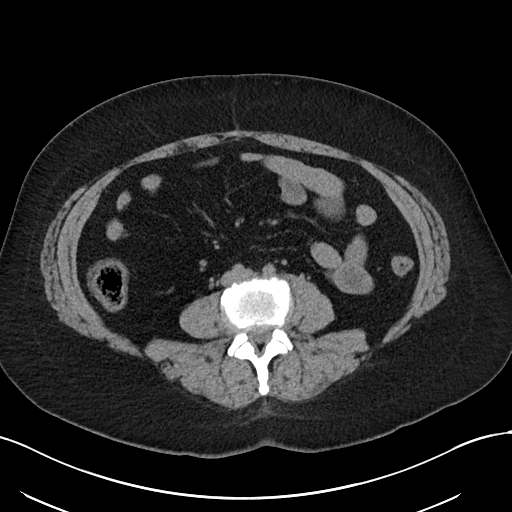
[im 62/98  soft-tissue]
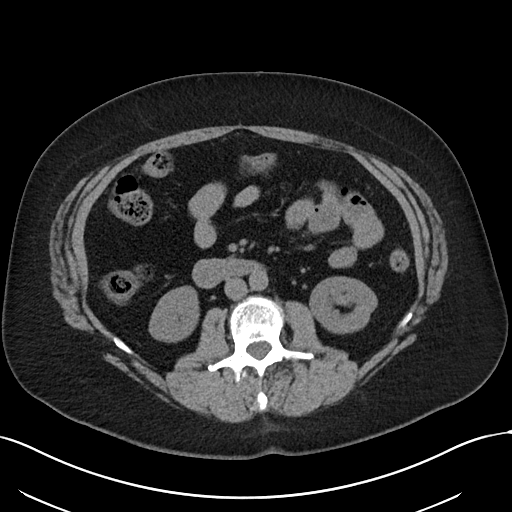
[im 67/98  soft-tissue]
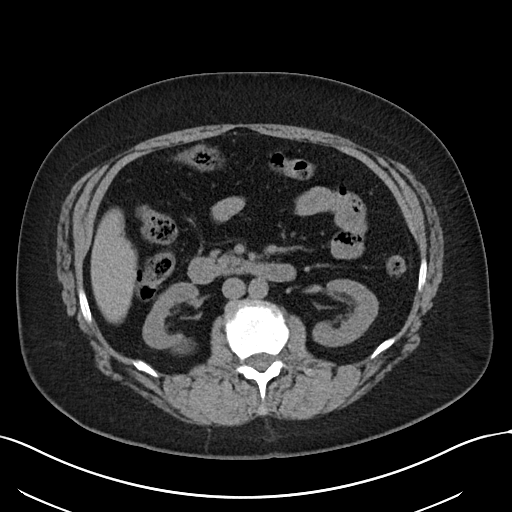
[im 67/98  bone]
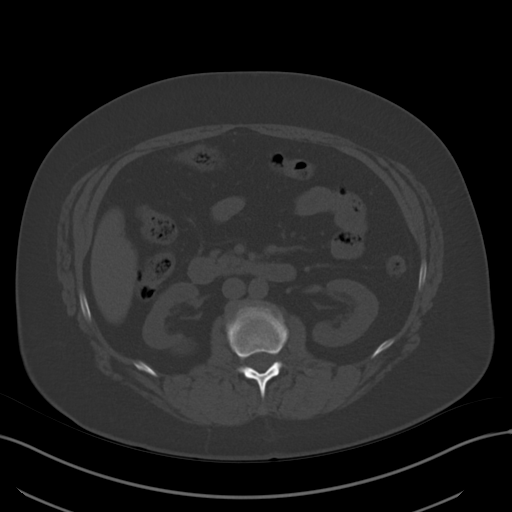
[im 77/98  soft-tissue]
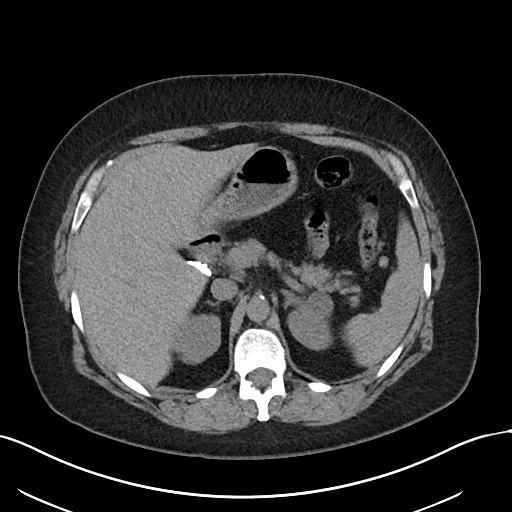
[im 82/98  soft-tissue]
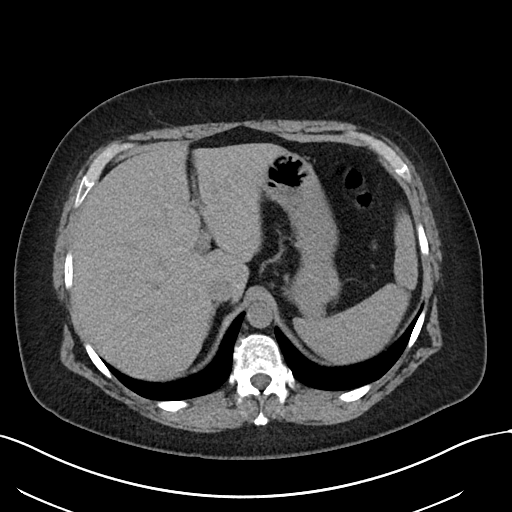
[im 92/98  soft-tissue]
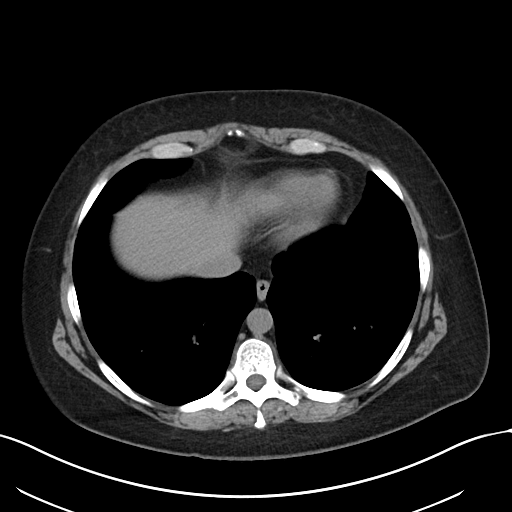

[Series 6: cor · coronal · 0.67mm/px · 3 of 86 slices shown]
[im 29/86  soft-tissue]
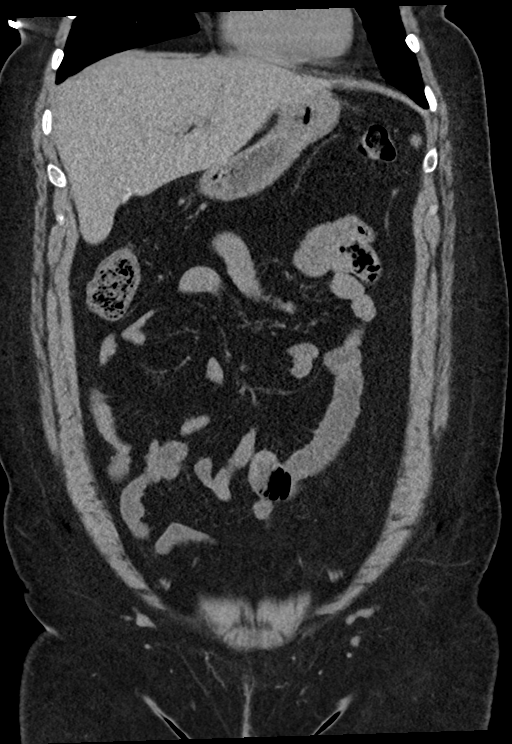
[im 38/86  soft-tissue]
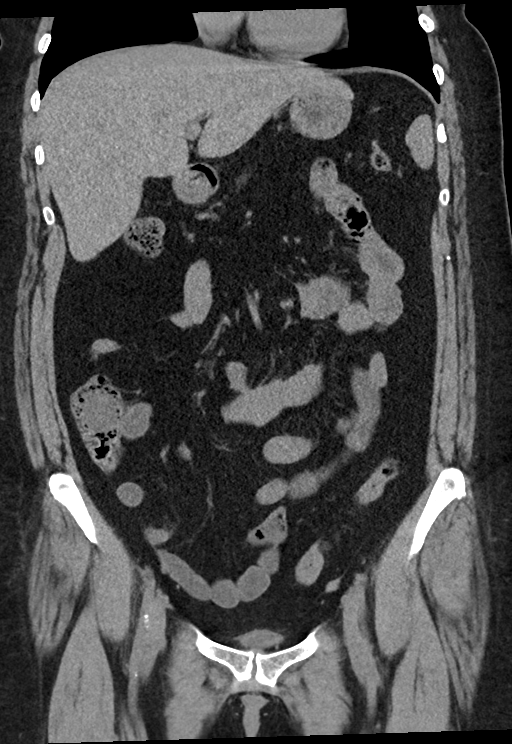
[im 48/86  soft-tissue]
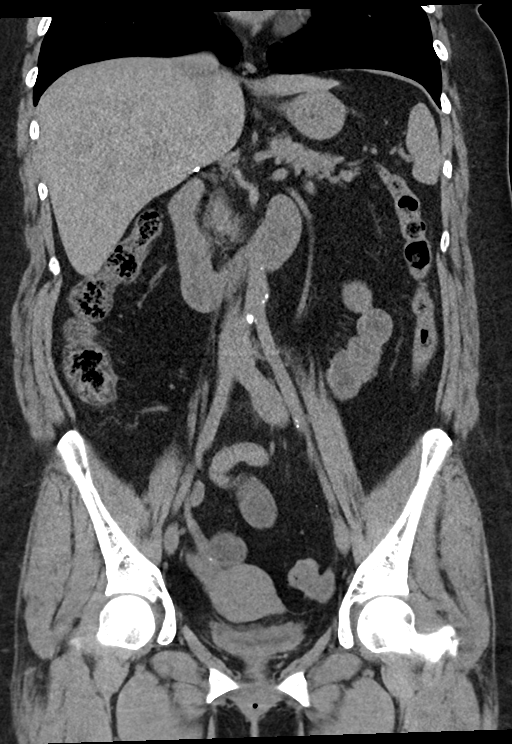

[15 of 46 positions shown; findings below may reference images not displayed]

FINDINGS: Lower chest: Left lower lobe calcified granuloma. No acute
abnormality.

Hepatobiliary: Unremarkable noncontrast appearance of the hepatic
parenchyma. Gallbladder surgically absent. No biliary ductal
dilation.

Pancreas: No pancreatic ductal dilation or evidence of acute
inflammation.

Spleen: Normal in size without focal abnormality.

Adrenals/Urinary Tract: Bilateral adrenal glands are within normal
limits. No hydronephrosis. Punctate nonobstructive right upper pole
renal calculus. No obstructive ureteral or bladder calculi
identified. No contour deforming renal mass. Urinary bladder is
nondistended limiting evaluation.

Stomach/Bowel: No enteric contrast was administered. Stomach is
unremarkable for degree of distension. No pathologic dilation of
small or large bowel. The appendix and terminal ileum appear normal.
Mild submucosal fatty infiltration of the ascending colonic wall. No
evidence of acute bowel inflammation.

Vascular/Lymphatic: Scattered aortic atherosclerosis. No abdominal
aortic aneurysm. No pathologically enlarged abdominal or pelvic
lymph nodes.

Reproductive: Uterus and bilateral adnexa are unremarkable.

Other: No abdominal wall hernia or abnormality. No abdominopelvic
ascites.

Musculoskeletal: Mild thoracolumbar spondylosis. No acute osseous
abnormality.
IMPRESSION: 1. Punctate nonobstructive right upper pole renal calculus. No
obstructive ureteral or bladder calculi identified.
2. Mild submucosal fatty infiltration of the ascending colonic wall,
which can be seen in the setting of chronic inflammation.

## 2023-06-10 ENCOUNTER — Other Ambulatory Visit: Payer: Self-pay

## 2023-06-10 ENCOUNTER — Emergency Department: Payer: Self-pay

## 2023-06-10 ENCOUNTER — Emergency Department
Admission: EM | Admit: 2023-06-10 | Discharge: 2023-06-10 | Disposition: A | Discharge status: Home / Self Care | Payer: Self-pay | Attending: Emergency Medicine | Admitting: Emergency Medicine

## 2023-06-10 DIAGNOSIS — M79672 Pain in left foot: Secondary | ICD-10-CM | POA: Insufficient documentation

## 2023-06-10 MED ORDER — DICLOFENAC SODIUM 1 % EX GEL: 2.0000 g | Freq: Four times a day (QID) | CUTANEOUS | 0 refills | Status: AC

## 2023-06-10 NOTE — ED Triage Notes (Signed)
Pt comes with c/o left foot pain for awhile. Pt states it started in right foot then moved. Pt states pain and burning. Pt denies any known injuries.

## 2023-06-10 NOTE — Discharge Instructions (Signed)
Please follow-up with your primary care provider for reassessment in a couple weeks.  I sent medication to your pharmacy for you to take as prescribed.

## 2023-06-10 NOTE — ED Provider Notes (Signed)
[facility] Provider Note    Event Date/Time   First MD Initiated Contact with Patient 06/10/23 1143     (approximate)   History   Foot Pain   HPI Terri Hogan is a 58 y.o. female presenting today for left foot pain.  Patient states about a month ago she started having pain on the lateral side of her left foot.  Notices a burning sensation that is constant when she is up walking around.  Denies any obvious trauma.  No pain to her ankle or any other aspect of her foot.  No obvious redness or swelling that she has noted.  No numbness or tingling.  No prior history of diabetes or gout.     Physical Exam   Triage Vital Signs: ED Triage Vitals  Encounter Vitals Group     BP 06/10/23 1052 (!) 155/83     Systolic BP Percentile --      Diastolic BP Percentile --      Pulse Rate 06/10/23 1052 78     Resp 06/10/23 1052 18     Temp 06/10/23 1052 98 F (36.7 C)     Temp src --      SpO2 06/10/23 1052 98 %     Weight 06/10/23 1051 200 lb (90.7 kg)     Height 06/10/23 1051 5\' 4"  (1.626 m)     Head Circumference --      Peak Flow --      Pain Score 06/10/23 1051 8     Pain Loc --      Pain Education --      Exclude from Growth Chart --     Most recent vital signs: Vitals:   06/10/23 1052  BP: (!) 155/83  Pulse: 78  Resp: 18  Temp: 98 F (36.7 C)  SpO2: 98%   I have reviewed the vital signs. General:  Awake, alert, no acute distress. Head:  Normocephalic, Atraumatic. EENT:  PERRL, EOMI, Oral mucosa pink and moist, Neck is supple. Cardiovascular: Regular rate, 2+ distal pulses. Respiratory:  Normal respiratory effort, symmetrical expansion, no distress.   Extremities:  Moving all four extremities through full ROM without pain.  No obvious swelling or erythema to left foot.  Slight tenderness palpation of the lateral fifth metatarsal. Neuro:  Alert and oriented.  Interacting appropriately.   Skin:  Warm, dry, no rash.   Psych: Appropriate affect.     ED Results / Procedures / Treatments   Labs (all labs ordered are listed, but only abnormal results are displayed) Labs Reviewed - No data to display   EKG    RADIOLOGY Independently interpreted left foot x-ray with no acute pathology   PROCEDURES:  Critical Care performed: No  Procedures   MEDICATIONS ORDERED IN ED: Medications - No data to display   IMPRESSION / MDM / ASSESSMENT AND PLAN / ED COURSE  I reviewed the triage vital signs and the nursing notes.                              Differential diagnosis includes, but is not limited to, stress fracture, Jones fracture, neuropathy, soft tissue inflammation  Patient's presentation is most consistent with acute complicated illness / injury requiring diagnostic workup.  Patient is a 58 year old female presenting today for left-sided foot pain without obvious trauma.  Given chronicity of symptoms over a month, will get x-ray to rule out stress fracture  Jones fracture of the fifth metatarsal.  No history of gout or diabetes to suggest peripheral neuropathy or gout.  No obvious swelling or erythema at this site either.  X-ray negative for stress fracture or other fracture.  Will discharge patient on Voltaren gel for symptomatic treatment and told her to follow-up with PCP for reassessment in a couple weeks.     FINAL CLINICAL IMPRESSION(S) / ED DIAGNOSES   Final diagnoses:  Left foot pain     Rx / DC Orders   ED Discharge Orders          Ordered    diclofenac Sodium (VOLTAREN) 1 % GEL  4 times daily        06/10/23 1255             Note:  This document was prepared using Dragon voice recognition software and may include unintentional dictation errors.   Janith Lima, MD 06/10/23 1255

## 2024-01-02 ENCOUNTER — Emergency Department
Admission: EM | Admit: 2024-01-02 | Discharge: 2024-01-02 | Disposition: A | Discharge status: Home / Self Care | Payer: Self-pay | Attending: Emergency Medicine | Admitting: Emergency Medicine

## 2024-01-02 ENCOUNTER — Other Ambulatory Visit: Payer: Self-pay

## 2024-01-02 DIAGNOSIS — I10 Essential (primary) hypertension: Secondary | ICD-10-CM | POA: Insufficient documentation

## 2024-01-02 DIAGNOSIS — H6502 Acute serous otitis media, left ear: Secondary | ICD-10-CM

## 2024-01-02 MED ORDER — FLUTICASONE PROPIONATE 50 MCG/ACT NA SUSP
1.0000 | Freq: Every day | NASAL | 0 refills | Status: AC
  Filled 2024-01-02: qty 16, 60d supply, fill #0
  Filled 2024-01-02: qty 11.1, 36d supply, fill #0

## 2024-01-02 MED ORDER — PREDNISONE 20 MG PO TABS
60.0000 mg | ORAL_TABLET | Freq: Once | ORAL | Status: AC
  Administered 2024-01-02 (×2): 60 mg via ORAL
  Filled 2024-01-02: qty 3

## 2024-01-02 MED ORDER — ATORVASTATIN CALCIUM 10 MG PO TABS
10.0000 mg | ORAL_TABLET | Freq: Every day | ORAL | 1 refills | Status: AC
  Filled 2024-01-02: qty 30, 30d supply, fill #0
  Filled 2024-01-02: qty 90, 90d supply, fill #0

## 2024-01-02 MED ORDER — CETIRIZINE HCL 5 MG PO TABS
5.0000 mg | ORAL_TABLET | Freq: Every day | ORAL | 0 refills | Status: AC
Start: 2024-01-02 — End: 2024-02-01
  Filled 2024-01-02: qty 30, 30d supply, fill #0

## 2024-01-02 MED ORDER — LORATADINE 10 MG PO TABS
10.0000 mg | ORAL_TABLET | Freq: Once | ORAL | Status: AC
  Administered 2024-01-02 (×2): 10 mg via ORAL
  Filled 2024-01-02: qty 1

## 2024-01-02 MED ORDER — HYDROCHLOROTHIAZIDE 25 MG PO TABS
25.0000 mg | ORAL_TABLET | Freq: Every day | ORAL | 0 refills | Status: AC
  Filled 2024-01-02 (×2): qty 30, 30d supply, fill #0

## 2024-01-02 MED ORDER — PREDNISONE 10 MG PO TABS
ORAL_TABLET | ORAL | 0 refills | Status: AC
  Filled 2024-01-02 (×2): qty 21, 6d supply, fill #0
  Filled 2024-01-02 (×2): qty 21, fill #0
  Filled 2024-01-02: qty 21, 6d supply, fill #0

## 2024-01-02 MED ORDER — AMLODIPINE BESYLATE 5 MG PO TABS
10.0000 mg | ORAL_TABLET | Freq: Once | ORAL | Status: AC
  Administered 2024-01-02 (×2): 10 mg via ORAL
  Filled 2024-01-02: qty 2

## 2024-01-02 MED ORDER — AMLODIPINE BESYLATE 10 MG PO TABS
10.0000 mg | ORAL_TABLET | Freq: Every day | ORAL | 1 refills | Status: AC
  Filled 2024-01-02 (×2): qty 30, 30d supply, fill #0
  Filled 2024-01-02: qty 90, 90d supply, fill #0

## 2024-01-02 NOTE — ED Triage Notes (Signed)
 Patient states left ear pain and hearing loss for one week; also reports sinus congestion.

## 2024-01-02 NOTE — Group Note (Deleted)
 Date:  01/02/2024 Time:  2:22 PM  Group Topic/Focus:  Wellness Toolbox:   The focus of this group is to discuss various aspects of wellness, balancing those aspects and exploring ways to increase the ability to experience wellness.  Patients will create a wellness toolbox for use upon discharge.     Participation Level:  {BHH PARTICIPATION OZCZO:77735}  Participation Quality:  {BHH PARTICIPATION QUALITY:22265}  Affect:  {BHH AFFECT:22266}  Cognitive:  {BHH COGNITIVE:22267}  Insight: {BHH Insight2:20797}  Engagement in Group:  {BHH ENGAGEMENT IN HMNLE:77731}  Modes of Intervention:  {BHH MODES OF INTERVENTION:22269}  Additional Comments:  ***  Myra Curtistine BROCKS 01/02/2024, 2:22 PM

## 2024-01-02 NOTE — ED Provider Notes (Signed)
 [facility] Emergency Department Provider Note     Event Date/Time   First MD Initiated Contact with Patient 01/02/24 1550     (approximate)   History   Otalgia   HPI  Terri Hogan is a 58 y.o. female presents to the ED for evaluation of worsening left ear pain and decreased hearing. She denies FCS, NV, tinnitus, or sore throat. She does describe vertigo, noted as the room spinning with positional changes.  she also notes some nasal congestion.  Patient admits to discontinuing all her previously prescribed maintenance medications for hypertension, hyperlipidemia, and anxiety/depression.  She denies any current medical home.  Physical Exam   Triage Vital Signs: ED Triage Vitals  Encounter Vitals Group     BP 01/02/24 1353 104/80     Girls Systolic BP Percentile --      Girls Diastolic BP Percentile --      Boys Systolic BP Percentile --      Boys Diastolic BP Percentile --      Pulse Rate 01/02/24 1353 69     Resp 01/02/24 1353 18     Temp 01/02/24 1353 97.7 F (36.5 C)     Temp Source 01/02/24 1353 Oral     SpO2 01/02/24 1353 98 %     Weight --      Height --      Head Circumference --      Peak Flow --      Pain Score 01/02/24 1354 3     Pain Loc --      Pain Education --      Exclude from Growth Chart --     Most recent vital signs: Vitals:   01/02/24 1353 01/02/24 1608  BP: 104/80 (!) 182/95  Pulse: 69 71  Resp: 18 20  Temp: 97.7 F (36.5 C)   SpO2: 98% 100%    General Awake, no distress. NAD HEENT NCAT. PERRL. EOMI. No rhinorrhea. Mucous membranes are moist.  Left TM erythematous, retracted, with a serous effusion noted. CV:  Good peripheral perfusion.  RESP:  Normal effort.  ABD:  No distention.  NEURO: Cranial nerves II to XII grossly intact.   ED Results / Procedures / Treatments   Labs (all labs ordered are listed, but only abnormal results are displayed) Labs Reviewed - No data to  display   EKG   RADIOLOGY  No results found.  PROCEDURES:  Critical Care performed: No  Procedures   MEDICATIONS ORDERED IN ED: Medications  predniSONE  (DELTASONE ) tablet 60 mg (has no administration in time range)  loratadine  (CLARITIN ) tablet 10 mg (has no administration in time range)  amLODipine  (NORVASC ) tablet 10 mg (has no administration in time range)     IMPRESSION / MDM / ASSESSMENT AND PLAN / ED COURSE  I reviewed the triage vital signs and the nursing notes.                              Differential diagnosis includes, but is not limited to, AOM, otitis external, sinusitis, rhinitis, sore throat. Cerumen impaction   Patient's presentation is most consistent with acute, uncomplicated illness.  Patient's diagnosis is consistent with left otalgia secondary to serous effusion. Patient will be discharged home with prescriptions for prednisone , cetirizine , and Flonase .  Patient is also given courtesy prescription for her blood pressure and cholesterol medications. Patient is to follow up with a local community clinic to  reestablish medical care at home.  As needed or otherwise directed. Patient is given ED precautions to return to the ED for any worsening or new symptoms.   FINAL CLINICAL IMPRESSION(S) / ED DIAGNOSES   Final diagnoses:  Non-recurrent acute serous otitis media of left ear  Uncontrolled hypertension     Rx / DC Orders   ED Discharge Orders          Ordered    amLODipine  (NORVASC ) 10 MG tablet  Daily        01/02/24 1650    atorvastatin  (LIPITOR) 10 MG tablet  Daily        01/02/24 1650    hydrochlorothiazide  (HYDRODIURIL ) 25 MG tablet  Daily        01/02/24 1650    predniSONE  (STERAPRED UNI-PAK 21 TAB) 10 MG (21) TBPK tablet        01/02/24 1650    fluticasone  (FLONASE ) 50 MCG/ACT nasal spray  Daily        01/02/24 1650    cetirizine  (ZYRTEC ) 5 MG tablet  Daily        01/02/24 1650             Note:  This document was prepared  using Dragon voice recognition software and may include unintentional dictation errors.    Loyd Candida LULLA Aldona, PA-C 01/02/24 1655    Suzanne Kirsch, MD 01/05/24 1605

## 2024-01-02 NOTE — Discharge Instructions (Addendum)
 Take the prescription meds as directed.  Selected follow-up with one of the community clinics listed above.  Return to the ED if necessary.

## 2024-01-02 NOTE — ED Provider Triage Note (Signed)
 Emergency Medicine Provider Triage Evaluation Note  Terri Hogan , a 58 y.o. female  was evaluated in triage.  Pt complains of hearing loss in left ear. Symptoms have been intermittent for the past week. Now having earache. No fever.  Physical Exam  BP 104/80   Pulse 69   Temp 97.7 F (36.5 C) (Oral)   Resp 18   SpO2 98%  Gen:   Awake, no distress   Resp:  Normal effort  MSK:   Moves extremities without difficulty  Other:    Medical Decision Making  Medically screening exam initiated at 1:57 PM.  Appropriate orders placed.  Terri Hogan was informed that the remainder of the evaluation will be completed by another provider, this initial triage assessment does not replace that evaluation, and the importance of remaining in the ED until their evaluation is complete.    [name], FNP 01/02/24 1401

## 2024-01-08 ENCOUNTER — Other Ambulatory Visit: Payer: Self-pay
